# Patient Record
Sex: Female | Born: 1980 | Race: White | Hispanic: No | Marital: Married | Smoking: Never smoker
Health system: Southern US, Community
[De-identification: ages and names within clinical notes are randomized; demographics above are authoritative.]

## PROBLEM LIST (undated history)

## (undated) DIAGNOSIS — E559 Vitamin D deficiency, unspecified: Secondary | ICD-10-CM

## (undated) DIAGNOSIS — F419 Anxiety disorder, unspecified: Secondary | ICD-10-CM

## (undated) DIAGNOSIS — J302 Other seasonal allergic rhinitis: Secondary | ICD-10-CM

## (undated) HISTORY — DX: Vitamin D deficiency, unspecified: E55.9

## (undated) HISTORY — DX: Anxiety disorder, unspecified: F41.9

---

## 2005-02-13 ENCOUNTER — Ambulatory Visit: Payer: Self-pay | Admitting: Internal Medicine

## 2007-06-12 ENCOUNTER — Ambulatory Visit: Payer: Self-pay | Admitting: Internal Medicine

## 2007-06-20 ENCOUNTER — Ambulatory Visit: Payer: Self-pay | Admitting: General Surgery

## 2007-06-22 ENCOUNTER — Emergency Department: Payer: Self-pay | Admitting: General Surgery

## 2007-07-18 HISTORY — PX: CHOLECYSTECTOMY: SHX55

## 2010-11-16 ENCOUNTER — Ambulatory Visit (INDEPENDENT_AMBULATORY_CARE_PROVIDER_SITE_OTHER): Payer: BC Managed Care – PPO | Admitting: Family Medicine

## 2010-11-16 ENCOUNTER — Encounter: Payer: Self-pay | Admitting: Family Medicine

## 2010-11-16 VITALS — BP 130/85 | Ht 64.0 in | Wt 140.0 lb

## 2010-11-16 DIAGNOSIS — M84362A Stress fracture, left tibia, initial encounter for fracture: Secondary | ICD-10-CM

## 2010-11-16 DIAGNOSIS — M84369A Stress fracture, unspecified tibia and fibula, initial encounter for fracture: Secondary | ICD-10-CM

## 2010-11-16 NOTE — Progress Notes (Signed)
  Subjective:    Patient ID: Andrea Velez, female    DOB: 1981/03/30, 30 y.o.   MRN: [id number]  HPI 30 year old new female here for evaluation of left lower extremity pain. Patient states she started running 3 months ago, has worked her way up to running 9 miles per week, usually doing 3 miles 3 times per week. She states she started having ankle pain about 4 weeks ago during her run, and this was just after she got some new [employer]ics running shoes. This continued for the next couple weeks, and then she bought some new Brooks shoes to run in. This seemed to help but her pain some but her left lower shin pain persisted. Most notably 5 days ago she ran in a 5K, and by the end of the race she states her left lower extremity in the shin and ankle was extremely painful and she could barely walk. She is worried about a stress fracture. The pain is somewhat constant now, but definite worse with walking. Occasionally wakes her up at night. She has not taken any medicines for it.   Review of Systems Denies fever, sweats, chills, weight loss. Social history: Married with 2 daughters. She works at an [employer]. She does not smoke. Otherwise healthy.    Objective:   Physical Exam Gen. appearance: Well-appearing female in no distress Neck: Supple Psych: Normal affect Neuro: alert and oriented Eyes: Extraocular muscles intact ENT: Moist because membranes Lungs no labored breathing Abdomen: Soft Left knee full range of motion no effusion Left tibia: Positive tenderness at the midportion of the posterior medial border of the tibia as well as distal third about 2-3 inches proximal from the medial malleolus. She has good ankle range of motion and strength. She is unable to perform a hop test. She also has pain with tuning fork test. She has a negative tap test.  MSK. ultrasound of the left tibia: Longitudinal and transverse images of the midsection of the tibia show increased Doppler flow with minimal  edema but no cortical irregularities. At the distal third of the tibia, there is increased blood flow with edema and a small cortical irregularity best seen on transverse view.      Assessment & Plan:  Left distal third posterior medial tibial stress fracture -Placed her in a long-leg Aircast -Gave her the tibial stress fracture rehabilitation protocol, and explained the need to refrain from running in a 5K anytime soon -Calcium 2000 mg daily along with [phone] IUs of vitamin D daily as well -Toe Lifts and heel lifts for lower extremity strengthening -Followup in one month

## 2010-12-14 ENCOUNTER — Encounter: Payer: Self-pay | Admitting: Family Medicine

## 2010-12-14 ENCOUNTER — Ambulatory Visit (INDEPENDENT_AMBULATORY_CARE_PROVIDER_SITE_OTHER): Payer: BC Managed Care – PPO | Admitting: Family Medicine

## 2010-12-14 VITALS — BP 119/80

## 2010-12-14 DIAGNOSIS — M76829 Posterior tibial tendinitis, unspecified leg: Secondary | ICD-10-CM

## 2010-12-14 DIAGNOSIS — M76822 Posterior tibial tendinitis, left leg: Secondary | ICD-10-CM

## 2010-12-14 DIAGNOSIS — M84369A Stress fracture, unspecified tibia and fibula, initial encounter for fracture: Secondary | ICD-10-CM

## 2010-12-14 DIAGNOSIS — M84362A Stress fracture, left tibia, initial encounter for fracture: Secondary | ICD-10-CM

## 2010-12-16 DIAGNOSIS — M76822 Posterior tibial tendinitis, left leg: Secondary | ICD-10-CM | POA: Insufficient documentation

## 2010-12-16 NOTE — Progress Notes (Signed)
  Subjective:    Patient ID: Andrea Velez, female    DOB: March 27, 1981, 30 y.o.   MRN: [id number]  HPI 30 yo F f/u Lt tibial stress fx.  Has been doing long leg aircast, Ca++, Vit D, and return to activity protocol.  Did not bike 1st 2 weeks, instead did Pilates pain free.  Has ran twice, having a little pain in shin but not much.  Also icing. Also continues with medial ankle pain.  This was her original complaint last visit as well, but at that time I felt it was related to her shin stress fx.   Review of Systems No F, S, C    Objective:   Physical Exam Gen: NAD Lt leg: point ttp over distal 1/3 posteromedial tibia, improved from last exam. Lt ankle: Mild/moderate tenderness in the medial ankle in the region of the posterior tibial tendon. Minimal swelling in this area. Normal posterior tibial pulse. Normal posterior tibial tendon function. Gait: She has fairly neutral feet but does go into slight overpronation left greater than right with walking.  MSK [place] Lt tibia: Decreased neovessels. Still shows persistent small cortical defect on transverse view of the distal third of the tibia. Minimal overlying hypoechoic fluid. MSK ultrasound left ankle: Mild increased thickness of the posterior tibial tendon with surrounding hypoechoic fluid. No significant increased Doppler flow. Longitudinal view shows no obvious tear.       Assessment & Plan:  #1 left tibial stress fracture -Rest a couple of days, and then she will restart her rehabilitation protocol at the beginning of week 3 of the program. She really needs to keep her pain less than at 3/10 when running. -Continue her long leg Aircast -Continue calcium and vitamin D -Followup in one month  #2 left posterior tibialis tendinitis -Think there is evidence of this on ultrasound and is likely the cause of her medial ankle pain. -Do not think at this point is true tendinopathy with no neovascularity -Sports insoles with scaphoid pads to  prevent overpronation -Over-the-counter Aleve and ibuprofen for pain -If this does not work, and next visit I would probably start with a topical anti-inflammatory, but can consider nitroglycerin. However I like to avoid this if I can and until her stress fracture heals -Followup 1 month

## 2011-01-11 ENCOUNTER — Ambulatory Visit: Payer: BC Managed Care – PPO | Admitting: Family Medicine

## 2013-12-12 LAB — HM PAP SMEAR: HM Pap smear: NEGATIVE

## 2014-10-28 DIAGNOSIS — Z9109 Other allergy status, other than to drugs and biological substances: Secondary | ICD-10-CM

## 2014-10-28 DIAGNOSIS — F419 Anxiety disorder, unspecified: Secondary | ICD-10-CM

## 2014-12-17 ENCOUNTER — Encounter: Payer: Self-pay | Admitting: *Deleted

## 2014-12-18 ENCOUNTER — Encounter: Payer: Self-pay | Admitting: Obstetrics and Gynecology

## 2014-12-18 ENCOUNTER — Ambulatory Visit (INDEPENDENT_AMBULATORY_CARE_PROVIDER_SITE_OTHER): Payer: BC Managed Care – PPO | Admitting: Obstetrics and Gynecology

## 2014-12-18 VITALS — BP 131/79 | HR 66 | Ht 64.0 in | Wt 136.8 lb

## 2014-12-18 DIAGNOSIS — E559 Vitamin D deficiency, unspecified: Secondary | ICD-10-CM

## 2014-12-18 DIAGNOSIS — M79671 Pain in right foot: Secondary | ICD-10-CM | POA: Diagnosis not present

## 2014-12-18 DIAGNOSIS — Z0000 Encounter for general adult medical examination without abnormal findings: Secondary | ICD-10-CM

## 2014-12-18 MED ORDER — VITAMIN D (ERGOCALCIFEROL) 1.25 MG (50000 UNIT) PO CAPS: 50000.0000 [IU] | ORAL_CAPSULE | ORAL | Status: DC

## 2014-12-18 NOTE — Evaluation (Signed)
SUBJECTIVE:  34 y.o. female for annual routine Pap and checkup. Current Outpatient Prescriptions  Medication Sig Dispense Refill  . levonorgestrel (MIRENA) 20 MCG/24HR IUD 1 each by Intrauterine route once.    . loratadine (CLARITIN) 10 MG tablet Take 10 mg by mouth daily.    . Vitamin D, Ergocalciferol, (DRISDOL) 50000 UNITS CAPS capsule Take 1 capsule by mouth once a week.  4   No current facility-administered medications for this visit.   Allergies: Review of patient's allergies indicates no known allergies.  No LMP recorded (lmp unknown). Patient is not currently having periods (Reason: IUD).  ROS:  Feeling well. No dyspnea or chest pain on exertion.  No abdominal pain, change in bowel habits, black or bloody stools.  No urinary tract symptoms. GYN ROS: normal menses, no abnormal bleeding, pelvic pain or discharge, no breast pain or new or enlarging lumps on self exam. No neurological complaints.  OBJECTIVE:  The patient appears well, alert, oriented x 3, in no distress. BP 131/79 mmHg  Pulse 66  Ht 5\' 4"  (1.626 m)  Wt 136 lb 12.8 oz (62.052 kg)  BMI 23.47 kg/m2  LMP  (LMP Unknown) ENT normal.  Neck supple. No adenopathy or thyromegaly. PERLA. Lungs are clear, good air entry, no wheezes, rhonchi or rales. S1 and S2 normal, no murmurs, regular rate and rhythm. Abdomen soft without tenderness, guarding, mass or organomegaly. Extremities show no edema, normal peripheral pulses. Neurological is normal, no focal findings.  BREAST EXAM: breasts appear normal, no suspicious masses, no skin or nipple changes or axillary nodes  PELVIC EXAM: normal external genitalia, vulva, vagina, cervix (IUD string noted), uterus and adnexa  Rectal: deferred  Musculoskeletal: left great toe slightly edematous with pain at joint on palpation; crepitous noted.  ASSESSMENT:  1.well woman 2. Left great toe pain 3. Vitamin D deficiency  PLAN:  1.pap smear return annually or prn 2.Refer to [facility]  for evaluation 3.Refill Ergocalciferol x 1 year  Melody BurchardBurr, [place]

## 2014-12-25 ENCOUNTER — Other Ambulatory Visit: Payer: BC Managed Care – PPO

## 2014-12-25 ENCOUNTER — Encounter: Payer: Self-pay | Admitting: *Deleted

## 2014-12-25 LAB — PAP, THINPREP RFLX HPV WITH CT/GC: PAP SMEAR COMMENT: 0

## 2014-12-25 NOTE — Progress Notes (Signed)
Quick Note:  Please let her know her pa was negative, will need repeat in 3 years ______

## 2014-12-28 ENCOUNTER — Encounter: Payer: Self-pay | Admitting: *Deleted

## 2014-12-30 LAB — COMPREHENSIVE METABOLIC PANEL
ALT: 8 IU/L (ref 0–32)
AST: 17 IU/L (ref 0–40)
Albumin/Globulin Ratio: 1.9 (ref 1.1–2.5)
Albumin: 4.8 g/dL (ref 3.5–5.5)
Alkaline Phosphatase: 69 IU/L (ref 39–117)
BUN/Creatinine Ratio: 19 (ref 8–20)
BUN: 16 mg/dL (ref 6–20)
Bilirubin Total: 1 mg/dL (ref 0.0–1.2)
CHLORIDE: 101 mmol/L (ref 97–108)
CO2: 24 mmol/L (ref 18–29)
Calcium: 9.5 mg/dL (ref 8.7–10.2)
Creatinine, Ser: 0.83 mg/dL (ref 0.57–1.00)
GFR calc Af Amer: 106 mL/min/{1.73_m2} (ref >59)
GFR calc non Af Amer: 92 mL/min/{1.73_m2} (ref >59)
GLUCOSE: 90 mg/dL (ref 65–99)
Globulin, Total: 2.5 g/dL (ref 1.5–4.5)
POTASSIUM: 4.6 mmol/L (ref 3.5–5.2)
SODIUM: 140 mmol/L (ref 134–144)
TOTAL PROTEIN: 7.3 g/dL (ref 6.0–8.5)

## 2014-12-30 LAB — LIPID PANEL
Chol/HDL Ratio: 2.7 ratio units (ref 0.0–4.4)
Cholesterol, Total: 189 mg/dL (ref 100–199)
HDL: 69 mg/dL (ref >39)
LDL Calculated: 102 mg/dL — ABNORMAL HIGH (ref 0–99)
Triglycerides: 88 mg/dL (ref 0–149)
VLDL Cholesterol Cal: 18 mg/dL (ref 5–40)

## 2014-12-30 LAB — VITAMIN D 25 HYDROXY (VIT D DEFICIENCY, FRACTURES): Vit D, 25-Hydroxy: 33.5 ng/mL (ref 30.0–100.0)

## 2014-12-30 NOTE — Progress Notes (Signed)
Quick Note:  Please let her know all labs were normal, alse see if she has heard from podiatry about referral yet? ______

## 2015-01-01 ENCOUNTER — Encounter: Payer: Self-pay | Admitting: *Deleted

## 2015-01-13 ENCOUNTER — Telehealth: Payer: Self-pay | Admitting: Obstetrics and Gynecology

## 2015-01-13 NOTE — Telephone Encounter (Signed)
PT CALLED AND SHE LEFT MESSAG EON OFFICE MACHINE STATING SHE WOULD LIKE FOR YOU TO CALL HER BACK, SHE IS AT WORK YOU CAN CALL HER AT [phone] OR HER CELL.

## 2015-01-13 NOTE — Telephone Encounter (Signed)
Called pt gave lab results to pt, all results was mailed to pt, per pt states she never received them, mailed copies to pt again, [facility] to make sure rx was there, it was, pt hasnt heard about her Podiatry referral, and would like invite to [employer] [facility]

## 2015-01-14 ENCOUNTER — Other Ambulatory Visit: Payer: Self-pay | Admitting: Obstetrics and Gynecology

## 2015-01-14 DIAGNOSIS — M79673 Pain in unspecified foot: Secondary | ICD-10-CM

## 2015-03-08 ENCOUNTER — Ambulatory Visit: Payer: Self-pay | Admitting: Podiatry

## 2015-03-10 ENCOUNTER — Ambulatory Visit: Payer: Self-pay | Admitting: Podiatry

## 2015-12-22 ENCOUNTER — Telehealth: Payer: Self-pay | Admitting: Obstetrics and Gynecology

## 2015-12-22 MED ORDER — FLUCONAZOLE 150 MG PO TABS: 150.0000 mg | ORAL_TABLET | Freq: Every day | ORAL | Status: DC

## 2015-12-22 NOTE — Telephone Encounter (Signed)
Per KC- MNS ok to send in diflucan. Med erx pt aware.

## 2015-12-22 NOTE — Telephone Encounter (Signed)
Patient called stating she has a yeast infection and wanted to know if something can be called in for her. She has an appointment on Friday for her annual exam. [facility] [address]

## 2015-12-24 ENCOUNTER — Encounter: Payer: Self-pay | Admitting: Obstetrics and Gynecology

## 2015-12-24 ENCOUNTER — Ambulatory Visit (INDEPENDENT_AMBULATORY_CARE_PROVIDER_SITE_OTHER): Payer: BC Managed Care – PPO | Admitting: Obstetrics and Gynecology

## 2015-12-24 ENCOUNTER — Telehealth: Payer: Self-pay | Admitting: *Deleted

## 2015-12-24 VITALS — BP 115/68 | HR 82 | Ht 64.0 in | Wt 131.1 lb

## 2015-12-24 DIAGNOSIS — E559 Vitamin D deficiency, unspecified: Secondary | ICD-10-CM | POA: Diagnosis not present

## 2015-12-24 DIAGNOSIS — Z01419 Encounter for gynecological examination (general) (routine) without abnormal findings: Secondary | ICD-10-CM

## 2015-12-24 NOTE — Progress Notes (Signed)
Subjective:   Andrea Velez is a 35 y.o. 702P2002 Caucasian female here for a routine well-woman exam.  No LMP recorded. Patient is not currently having periods (Reason: IUD).    Current complaints: currently treating yeast infection and having lower pelvic pains when exercising. PCP: me       does desire labs  Social History: Sexual: heterosexual Marital Status: married Living situation: with family Occupation: unknown occupation Tobacco/alcohol: no tobacco use Illicit drugs: no history of illicit drug use  The following portions of the patient's history were reviewed and updated as appropriate: allergies, current medications, past family history, past medical history, past social history, past surgical history and problem list.  Past Medical History Past Medical History  Diagnosis Date  . Anxiety   . Vitamin D deficiency     Past Surgical History Past Surgical History  Procedure Laterality Date  . Cholecystectomy  2009    Gynecologic History G2P2002  No LMP recorded. Patient is not currently having periods (Reason: IUD). Contraception: IUD Last Pap: 2016. Results were: normal Last mammogram: NA.   Obstetric History OB History  Gravida Para Term Preterm AB SAB TAB Ectopic Multiple Living  2 2 2       2     # Outcome Date GA Lbr Len/2nd Weight Sex Delivery Anes PTL Lv  2 Term 2005   7 lb 14 oz (3.572 kg) F Vag-Spont   Y  1 Term 2002   7 lb 14 oz (3.572 kg) F Vag-Spont   Y      Current Medications Current Outpatient Prescriptions on File Prior to Visit  Medication Sig Dispense Refill  . levonorgestrel (MIRENA) 20 MCG/24HR IUD 1 each by Intrauterine route once.    . Vitamin D, Ergocalciferol, (DRISDOL) 50000 UNITS CAPS capsule Take 1 capsule (50,000 Units total) by mouth once a week. 30 capsule 4   No current facility-administered medications on file prior to visit.    Review of Systems Patient denies any headaches, blurred vision, shortness of breath, chest  pain, abdominal pain, problems with bowel movements, urination, or intercourse.  Objective:  BP 115/68 mmHg  Pulse 82  Ht 5\' 4"  (1.626 m)  Wt 131 lb 1.6 oz (59.467 kg)  BMI 22.49 kg/m2 Physical Exam  General:  Well developed, well nourished, no acute distress. She is alert and oriented x3. Skin:  Warm and dry Neck:  Midline trachea, no thyromegaly or nodules Cardiovascular: Regular rate and rhythm, no murmur heard Lungs:  Effort normal, all lung fields clear to auscultation bilaterally Breasts:  No dominant palpable mass, retraction, or nipple discharge Abdomen:  Soft, non tender, no hepatosplenomegaly or masses Pelvic:  External genitalia is normal in appearance.  The vagina is normal in appearance. The cervix is bulbous, no CMT.  Thin prep pap is not done.  IUD string noted Uterus is felt to be normal size, shape, and contour.  No adnexal masses or tenderness noted. Extremities:  No swelling or varicosities noted Psych:  She has a normal mood and affect  Assessment:   Healthy well-woman exam IUD check Vit D deficiency  Plan:  Routine screening labs F/U 1 year for AE, or sooner if needed   Olivea Sonnen Suzan NailerN Deshawna Mcneece, CNM

## 2015-12-24 NOTE — Patient Instructions (Signed)
Place annual gynecologic exam patient instructions here.

## 2015-12-24 NOTE — Telephone Encounter (Signed)
Patient wanted to ask Andrea Velez if she needed to change her IUD next year or this year. Patient is requesting a call back . [phone]. Thanks

## 2015-12-24 NOTE — Telephone Encounter (Signed)
It is next year- and remind her it will need to be a separate appointment for well visit.

## 2015-12-25 LAB — LIPID PANEL
CHOL/HDL RATIO: 2.6 ratio (ref 0.0–4.4)
CHOLESTEROL TOTAL: 199 mg/dL (ref 100–199)
HDL: 77 mg/dL (ref >39)
LDL CALC: 112 mg/dL — AB (ref 0–99)
Triglycerides: 50 mg/dL (ref 0–149)
VLDL CHOLESTEROL CAL: 10 mg/dL (ref 5–40)

## 2015-12-25 LAB — COMPREHENSIVE METABOLIC PANEL
ALK PHOS: 54 IU/L (ref 39–117)
ALT: 8 IU/L (ref 0–32)
AST: 16 IU/L (ref 0–40)
Albumin/Globulin Ratio: 1.9 (ref 1.2–2.2)
Albumin: 4.9 g/dL (ref 3.5–5.5)
BUN/Creatinine Ratio: 15 (ref 9–23)
BUN: 13 mg/dL (ref 6–20)
Bilirubin Total: 0.9 mg/dL (ref 0.0–1.2)
CALCIUM: 9.5 mg/dL (ref 8.7–10.2)
CO2: 21 mmol/L (ref 18–29)
CREATININE: 0.89 mg/dL (ref 0.57–1.00)
Chloride: 97 mmol/L (ref 96–106)
GFR calc Af Amer: 97 mL/min/{1.73_m2} (ref >59)
GFR, EST NON AFRICAN AMERICAN: 84 mL/min/{1.73_m2} (ref >59)
GLUCOSE: 85 mg/dL (ref 65–99)
Globulin, Total: 2.6 g/dL (ref 1.5–4.5)
Potassium: 3.9 mmol/L (ref 3.5–5.2)
Sodium: 138 mmol/L (ref 134–144)
Total Protein: 7.5 g/dL (ref 6.0–8.5)

## 2015-12-25 LAB — VITAMIN D 25 HYDROXY (VIT D DEFICIENCY, FRACTURES): VIT D 25 HYDROXY: 57.9 ng/mL (ref 30.0–100.0)

## 2015-12-27 ENCOUNTER — Telehealth: Payer: Self-pay | Admitting: Obstetrics and Gynecology

## 2015-12-27 MED ORDER — FLUCONAZOLE 150 MG PO TABS: 150.0000 mg | ORAL_TABLET | Freq: Once | ORAL | Status: DC

## 2015-12-27 NOTE — Telephone Encounter (Signed)
Pt aware Diflucan prescription sent to pharmacy. No additional refills.

## 2015-12-27 NOTE — Telephone Encounter (Signed)
Pt was told by Mel that she would call in another one time dosage for yeast infection medication, pt hasn't received it yet. Pt uses the [facility] in [place]. Please Francee Piccoloadivse

## 2016-01-26 ENCOUNTER — Other Ambulatory Visit: Payer: Self-pay | Admitting: Obstetrics and Gynecology

## 2016-12-29 ENCOUNTER — Encounter: Payer: Self-pay | Admitting: Obstetrics and Gynecology

## 2016-12-29 ENCOUNTER — Other Ambulatory Visit: Payer: Self-pay | Admitting: *Deleted

## 2016-12-29 ENCOUNTER — Other Ambulatory Visit: Payer: Self-pay | Admitting: Obstetrics and Gynecology

## 2016-12-29 ENCOUNTER — Ambulatory Visit (INDEPENDENT_AMBULATORY_CARE_PROVIDER_SITE_OTHER): Payer: 59 | Admitting: Obstetrics and Gynecology

## 2016-12-29 VITALS — BP 125/79 | HR 68 | Ht 64.0 in | Wt 132.0 lb

## 2016-12-29 DIAGNOSIS — Z01419 Encounter for gynecological examination (general) (routine) without abnormal findings: Secondary | ICD-10-CM

## 2016-12-29 DIAGNOSIS — Z30431 Encounter for routine checking of intrauterine contraceptive device: Secondary | ICD-10-CM | POA: Diagnosis not present

## 2016-12-29 MED ORDER — VITAMIN D (ERGOCALCIFEROL) 1.25 MG (50000 UNIT) PO CAPS: 50000.0000 [IU] | ORAL_CAPSULE | ORAL | 4 refills | Status: DC

## 2016-12-29 NOTE — Progress Notes (Signed)
Subjective:   Andrea Velez is a 36 y.o. 492P2002 Caucasian female here for a routine well-woman exam.  No LMP recorded. Patient is not currently having periods (Reason: IUD).    Current complaints: none PCP: me       does desire labs  Social History: Sexual: heterosexual Marital Status: married Living situation: with family Occupation: unknown occupation Tobacco/alcohol: no tobacco use Illicit drugs: no history of illicit drug use  The following portions of the patient's history were reviewed and updated as appropriate: allergies, current medications, past family history, past medical history, past social history, past surgical history and problem list.  Past Medical History Past Medical History:  Diagnosis Date  . Anxiety   . Vitamin D deficiency     Past Surgical History Past Surgical History:  Procedure Laterality Date  . CHOLECYSTECTOMY  2009    Gynecologic History G2P2002  No LMP recorded. Patient is not currently having periods (Reason: IUD). Contraception: IUD and vasectomy Last Pap: 2016. Results were: normal   Obstetric History OB History  Gravida Para Term Preterm AB Living  2 2 2     2   SAB TAB Ectopic Multiple Live Births          2    # Outcome Date GA Lbr Len/2nd Weight Sex Delivery Anes PTL Lv  2 Term 2005   7 lb 14 oz (3.572 kg) F Vag-Spont   LIV  1 Term 2002   7 lb 14 oz (3.572 kg) F Vag-Spont   LIV      Current Medications Current Outpatient Prescriptions on File Prior to Visit  Medication Sig Dispense Refill  . levonorgestrel (MIRENA) 20 MCG/24HR IUD 1 each by Intrauterine route once.    . Multiple Vitamins-Minerals (MULTIVITAMIN WITH MINERALS) tablet Take 1 tablet by mouth daily.    . fluconazole (DIFLUCAN) 150 MG tablet Take 1 tablet (150 mg total) by mouth once. (Patient not taking: Reported on 12/29/2016) 1 tablet 0  . Vitamin D, Ergocalciferol, (DRISDOL) 50000 units CAPS capsule TAKE ONE CAPSULE BY MOUTH ONCE A WEEK (Patient not taking:  Reported on 12/29/2016) 30 capsule 4   No current facility-administered medications on file prior to visit.     Review of Systems Patient denies any headaches, blurred vision, shortness of breath, chest pain, abdominal pain, problems with bowel movements, urination, or intercourse.  Objective:  BP 125/79   Pulse 68   Ht 5\' 4"  (1.626 m)   Wt 132 lb (59.9 kg)   BMI 22.66 kg/m  Physical Exam  General:  Well developed, well nourished, no acute distress. She is alert and oriented x3. Skin:  Warm and dry Neck:  Midline trachea, no thyromegaly or nodules Cardiovascular: Regular rate and rhythm, no murmur heard Lungs:  Effort normal, all lung fields clear to auscultation bilaterally Breasts:  No dominant palpable mass, retraction, or nipple discharge Abdomen:  Soft, non tender, no hepatosplenomegaly or masses Pelvic:  External genitalia is normal in appearance.  The vagina is normal in appearance. The cervix is bulbous, no CMT. IUD string noted  Thin prep pap is done with HR HPV cotesting. Uterus is felt to be normal size, shape, and contour.  No adnexal masses or tenderness noted.  Extremities:  No swelling or varicosities noted Psych:  She has a normal mood and affect  Assessment:   Healthy well-woman exam IUD check  Plan:  Will schedule appt to replace Mirena in the fall F/U 1 year for AE, or sooner if needed Mammogram  baseline ordered   Ruthann Angulo Suzan Nailer, CNM

## 2016-12-29 NOTE — Patient Instructions (Signed)
Preventive Care 18-39 Years, Female Preventive care refers to lifestyle choices and visits with your health care provider that can promote health and wellness. What does preventive care include?  A yearly physical exam. This is also called an annual well check.  Dental exams once or twice a year.  Routine eye exams. Ask your health care provider how often you should have your eyes checked.  Personal lifestyle choices, including: ? Daily care of your teeth and gums. ? Regular physical activity. ? Eating a healthy diet. ? Avoiding tobacco and drug use. ? Limiting alcohol use. ? Practicing safe sex. ? Taking vitamin and mineral supplements as recommended by your health care provider. What happens during an annual well check? The services and screenings done by your health care provider during your annual well check will depend on your age, overall health, lifestyle risk factors, and family history of disease. Counseling Your health care provider may ask you questions about your:  Alcohol use.  Tobacco use.  Drug use.  Emotional well-being.  Home and relationship well-being.  Sexual activity.  Eating habits.  Work and work Statistician.  Method of birth control.  Menstrual cycle.  Pregnancy history.  Screening You may have the following tests or measurements:  Height, weight, and BMI.  Diabetes screening. This is done by checking your blood sugar (glucose) after you have not eaten for a while (fasting).  Blood pressure.  Lipid and cholesterol levels. These may be checked every 5 years starting at age 38.  Skin check.  Hepatitis C blood test.  Hepatitis B blood test.  Sexually transmitted disease (STD) testing.  BRCA-related cancer screening. This may be done if you have a family history of breast, ovarian, tubal, or peritoneal cancers.  Pelvic exam and Pap test. This may be done every 3 years starting at age 38. Starting at age 30, this may be done  every 5 years if you have a Pap test in combination with an HPV test.  Discuss your test results, treatment options, and if necessary, the need for more tests with your health care provider. Vaccines Your health care provider may recommend certain vaccines, such as:  Influenza vaccine. This is recommended every year.  Tetanus, diphtheria, and acellular pertussis (Tdap, Td) vaccine. You may need a Td booster every 10 years.  Varicella vaccine. You may need this if you have not been vaccinated.  HPV vaccine. If you are 39 or younger, you may need three doses over 6 months.  Measles, mumps, and rubella (MMR) vaccine. You may need at least one dose of MMR. You may also need a second dose.  Pneumococcal 13-valent conjugate (PCV13) vaccine. You may need this if you have certain conditions and were not previously vaccinated.  Pneumococcal polysaccharide (PPSV23) vaccine. You may need one or two doses if you smoke cigarettes or if you have certain conditions.  Meningococcal vaccine. One dose is recommended if you are age 68-21 years and a first-year college student living in a residence hall, or if you have one of several medical conditions. You may also need additional booster doses.  Hepatitis A vaccine. You may need this if you have certain conditions or if you travel or work in places where you may be exposed to hepatitis A.  Hepatitis B vaccine. You may need this if you have certain conditions or if you travel or work in places where you may be exposed to hepatitis B.  Haemophilus influenzae type b (Hib) vaccine. You may need this  if you have certain risk factors.  Talk to your health care provider about which screenings and vaccines you need and how often you need them. This information is not intended to replace advice given to you by your health care provider. Make sure you discuss any questions you have with your health care provider. Document Released: 08/29/2001 Document Revised:  03/22/2016 Document Reviewed: 05/04/2015 Elsevier Interactive Patient Education  2017 Elsevier Inc.  

## 2016-12-30 LAB — COMPREHENSIVE METABOLIC PANEL
ALBUMIN: 4.9 g/dL (ref 3.5–5.5)
ALT: 7 IU/L (ref 0–32)
AST: 18 IU/L (ref 0–40)
Albumin/Globulin Ratio: 2 (ref 1.2–2.2)
Alkaline Phosphatase: 59 IU/L (ref 39–117)
BUN / CREAT RATIO: 14 (ref 9–23)
BUN: 11 mg/dL (ref 6–20)
Bilirubin Total: 0.8 mg/dL (ref 0.0–1.2)
CO2: 24 mmol/L (ref 20–29)
CREATININE: 0.78 mg/dL (ref 0.57–1.00)
Calcium: 9.6 mg/dL (ref 8.7–10.2)
Chloride: 102 mmol/L (ref 96–106)
GFR calc non Af Amer: 98 mL/min/{1.73_m2} (ref >59)
GFR, EST AFRICAN AMERICAN: 113 mL/min/{1.73_m2} (ref >59)
GLUCOSE: 90 mg/dL (ref 65–99)
Globulin, Total: 2.5 g/dL (ref 1.5–4.5)
Potassium: 4.8 mmol/L (ref 3.5–5.2)
Sodium: 141 mmol/L (ref 134–144)
TOTAL PROTEIN: 7.4 g/dL (ref 6.0–8.5)

## 2016-12-30 LAB — LIPID PANEL
CHOL/HDL RATIO: 2.7 ratio (ref 0.0–4.4)
Cholesterol, Total: 188 mg/dL (ref 100–199)
HDL: 70 mg/dL (ref >39)
LDL Calculated: 100 mg/dL — ABNORMAL HIGH (ref 0–99)
Triglycerides: 92 mg/dL (ref 0–149)
VLDL Cholesterol Cal: 18 mg/dL (ref 5–40)

## 2017-01-03 LAB — CYTOLOGY - PAP

## 2017-01-18 ENCOUNTER — Ambulatory Visit
Admission: RE | Admit: 2017-01-18 | Discharge: 2017-01-18 | Disposition: A | Discharge status: Home / Self Care | Payer: 59 | Source: Ambulatory Visit | Attending: Obstetrics and Gynecology | Admitting: Obstetrics and Gynecology

## 2017-01-18 DIAGNOSIS — Z1231 Encounter for screening mammogram for malignant neoplasm of breast: Secondary | ICD-10-CM | POA: Diagnosis present

## 2017-01-18 DIAGNOSIS — Z01419 Encounter for gynecological examination (general) (routine) without abnormal findings: Secondary | ICD-10-CM | POA: Insufficient documentation

## 2017-03-23 ENCOUNTER — Ambulatory Visit: Payer: 59 | Admitting: Obstetrics and Gynecology

## 2017-04-05 ENCOUNTER — Ambulatory Visit (INDEPENDENT_AMBULATORY_CARE_PROVIDER_SITE_OTHER): Payer: 59 | Admitting: Obstetrics and Gynecology

## 2017-04-05 ENCOUNTER — Encounter: Payer: Self-pay | Admitting: Obstetrics and Gynecology

## 2017-04-05 VITALS — BP 127/81 | HR 85 | Wt 124.2 lb

## 2017-04-05 DIAGNOSIS — Z30433 Encounter for removal and reinsertion of intrauterine contraceptive device: Secondary | ICD-10-CM | POA: Diagnosis not present

## 2017-04-05 NOTE — Patient Instructions (Signed)

## 2017-04-05 NOTE — Progress Notes (Signed)
Andrea Velez is a 36 y.o. year old G71P2002 Caucasian female who presents for removal and replacement of a Mirena IUD. She was given informed consent for removal and reinsertion of her Mirena. Her Mirena was placed 2012, No LMP recorded. Patient is not currently having periods (Reason: IUD)., and her pregnancy test today was negativ.   The risks and benefits of the method and placement have been thouroughly reviewed with the patient and all questions were answered.  Specifically the patient is aware of failure rate of 07/998, expulsion of the IUD and of possible perforation.  The patient is aware of irregular bleeding due to the method and understands the incidence of irregular bleeding diminishes with time.  Signed copy of informed consent in chart.   No LMP recorded. Patient is not currently having periods (Reason: IUD). BP 127/81   Pulse 85   Wt 124 lb 3.2 oz (56.3 kg)   BMI 21.32 kg/m  No results found for this or any previous visit (from the past 24 hour(s)).   Appropriate time out taken. A graves speculum was placed in the vagina.  The cervix was visualized, prepped using Betadine. The strings were visible. They were grasped and the Mirena was easily removed. The cervix was then grasped with a single-tooth tenaculum. The uterus was found to be neutral and it sounded to 8 cm.  Mirena IUD placed per manufacturer's recommendations without complications. The strings were trimmed to 3 cm.  The patient tolerated the procedure well.   Sonogram was performed and the proper placement of the IUD was verified via transvaginal u/s.   The patient was given post procedure instructions, including signs and symptoms of infection and to check for the strings after each menses or each month, and refraining from intercourse or anything in the vagina for 3 days.  She was given a Mirena care card with date Mirena placed, and date Mirena to be removed.    Anwen Cannedy Suzan Nailer, CNM

## 2017-05-24 ENCOUNTER — Telehealth: Payer: Self-pay | Admitting: Obstetrics and Gynecology

## 2017-05-24 NOTE — Telephone Encounter (Signed)
Patient called stating that she would like to receive a call back in regards to her bloodwork. No other information was disclosed. Please advise.

## 2017-05-28 NOTE — Telephone Encounter (Signed)
Called pt about daughters labs

## 2017-05-31 ENCOUNTER — Encounter: Payer: 59 | Admitting: Obstetrics and Gynecology

## 2018-01-04 ENCOUNTER — Encounter: Payer: 59 | Admitting: Obstetrics and Gynecology

## 2018-01-29 ENCOUNTER — Ambulatory Visit (INDEPENDENT_AMBULATORY_CARE_PROVIDER_SITE_OTHER): Payer: 59 | Admitting: Obstetrics and Gynecology

## 2018-01-29 ENCOUNTER — Encounter: Payer: Self-pay | Admitting: Obstetrics and Gynecology

## 2018-01-29 VITALS — BP 115/79 | HR 70 | Ht 64.0 in | Wt 132.3 lb

## 2018-01-29 DIAGNOSIS — Z01419 Encounter for gynecological examination (general) (routine) without abnormal findings: Secondary | ICD-10-CM | POA: Diagnosis not present

## 2018-01-29 MED ORDER — VITAMIN D (ERGOCALCIFEROL) 1.25 MG (50000 UNIT) PO CAPS: 50000.0000 [IU] | ORAL_CAPSULE | ORAL | 4 refills | Status: DC

## 2018-01-29 NOTE — Patient Instructions (Signed)
Preventive Care 18-39 Years, Female Preventive care refers to lifestyle choices and visits with your health care provider that can promote health and wellness. What does preventive care include?  A yearly physical exam. This is also called an annual well check.  Dental exams once or twice a year.  Routine eye exams. Ask your health care provider how often you should have your eyes checked.  Personal lifestyle choices, including: ? Daily care of your teeth and gums. ? Regular physical activity. ? Eating a healthy diet. ? Avoiding tobacco and drug use. ? Limiting alcohol use. ? Practicing safe sex. ? Taking vitamin and mineral supplements as recommended by your health care provider. What happens during an annual well check? The services and screenings done by your health care provider during your annual well check will depend on your age, overall health, lifestyle risk factors, and family history of disease. Counseling Your health care provider may ask you questions about your:  Alcohol use.  Tobacco use.  Drug use.  Emotional well-being.  Home and relationship well-being.  Sexual activity.  Eating habits.  Work and work Statistician.  Method of birth control.  Menstrual cycle.  Pregnancy history.  Screening You may have the following tests or measurements:  Height, weight, and BMI.  Diabetes screening. This is done by checking your blood sugar (glucose) after you have not eaten for a while (fasting).  Blood pressure.  Lipid and cholesterol levels. These may be checked every 5 years starting at age 38.  Skin check.  Hepatitis C blood test.  Hepatitis B blood test.  Sexually transmitted disease (STD) testing.  BRCA-related cancer screening. This may be done if you have a family history of breast, ovarian, tubal, or peritoneal cancers.  Pelvic exam and Pap test. This may be done every 3 years starting at age 38. Starting at age 30, this may be done  every 5 years if you have a Pap test in combination with an HPV test.  Discuss your test results, treatment options, and if necessary, the need for more tests with your health care provider. Vaccines Your health care provider may recommend certain vaccines, such as:  Influenza vaccine. This is recommended every year.  Tetanus, diphtheria, and acellular pertussis (Tdap, Td) vaccine. You may need a Td booster every 10 years.  Varicella vaccine. You may need this if you have not been vaccinated.  HPV vaccine. If you are 39 or younger, you may need three doses over 6 months.  Measles, mumps, and rubella (MMR) vaccine. You may need at least one dose of MMR. You may also need a second dose.  Pneumococcal 13-valent conjugate (PCV13) vaccine. You may need this if you have certain conditions and were not previously vaccinated.  Pneumococcal polysaccharide (PPSV23) vaccine. You may need one or two doses if you smoke cigarettes or if you have certain conditions.  Meningococcal vaccine. One dose is recommended if you are age 68-21 years and a first-year college student living in a residence hall, or if you have one of several medical conditions. You may also need additional booster doses.  Hepatitis A vaccine. You may need this if you have certain conditions or if you travel or work in places where you may be exposed to hepatitis A.  Hepatitis B vaccine. You may need this if you have certain conditions or if you travel or work in places where you may be exposed to hepatitis B.  Haemophilus influenzae type b (Hib) vaccine. You may need this  if you have certain risk factors.  Talk to your health care provider about which screenings and vaccines you need and how often you need them. This information is not intended to replace advice given to you by your health care provider. Make sure you discuss any questions you have with your health care provider. Document Released: 08/29/2001 Document Revised:  03/22/2016 Document Reviewed: 05/04/2015 Elsevier Interactive Patient Education  [employer].

## 2018-01-29 NOTE — Progress Notes (Signed)
Subjective:   Elmer Sowatalie B Coombs is a 37 y.o. 502P2002 Caucasian female here for a routine well-woman exam.  No LMP recorded. (Menstrual status: IUD).    Current complaints: none PCP: me       doesn't desire labs  Social History: Sexual: heterosexual Marital Status: married Living situation: with family Occupation: unknown occupation Tobacco/alcohol: no tobacco use Illicit drugs: no history of illicit drug use  The following portions of the patient's history were reviewed and updated as appropriate: allergies, current medications, past family history, past medical history, past social history, past surgical history and problem list.  Past Medical History Past Medical History:  Diagnosis Date  . Anxiety   . Vitamin D deficiency     Past Surgical History Past Surgical History:  Procedure Laterality Date  . CHOLECYSTECTOMY  2009    Gynecologic History G2P2002  No LMP recorded. (Menstrual status: IUD). Contraception: IUD Last Pap: 2018. Results were: normal Last mammogram: 2018. Results were: normal   Obstetric History OB History  Gravida Para Term Preterm AB Living  2 2 2     2   SAB TAB Ectopic Multiple Live Births          2    # Outcome Date GA Lbr Len/2nd Weight Sex Delivery Anes PTL Lv  2 Term 2005   7 lb 14 oz (3.572 kg) F Vag-Spont   LIV  1 Term 2002   7 lb 14 oz (3.572 kg) F Vag-Spont   LIV    Current Medications Current Outpatient Medications on File Prior to Visit  Medication Sig Dispense Refill  . levonorgestrel (MIRENA) 20 MCG/24HR IUD 1 each by Intrauterine route once.    . Multiple Vitamins-Minerals (MULTIVITAMIN WITH MINERALS) tablet Take 1 tablet by mouth daily.    . Vitamin D, Ergocalciferol, (DRISDOL) 50000 units CAPS capsule Take 1 capsule (50,000 Units total) by mouth once a week. (Patient not taking: Reported on 04/05/2017) 30 capsule 4   No current facility-administered medications on file prior to visit.     Review of Systems Patient denies  any headaches, blurred vision, shortness of breath, chest pain, abdominal pain, problems with bowel movements, urination, or intercourse.  Objective:  BP 115/79   Pulse 70   Ht 5\' 4"  (1.626 m)   Wt 132 lb 4.8 oz (60 kg)   BMI 22.71 kg/m  Physical Exam  General:  Well developed, well nourished, no acute distress. She is alert and oriented x3. Skin:  Warm and dry Neck:  Midline trachea, no thyromegaly or nodules Cardiovascular: Regular rate and rhythm, no murmur heard Lungs:  Effort normal, all lung fields clear to auscultation bilaterally Breasts:  No dominant palpable mass, retraction, or nipple discharge Abdomen:  Soft, non tender, no hepatosplenomegaly or masses Pelvic:  External genitalia is normal in appearance.  The vagina is normal in appearance. The cervix is bulbous, no CMT. IUD string noted. Thin prep pap isn't done. Uterus is felt to be normal size, shape, and contour.  No adnexal masses or tenderness noted. Extremities:  No swelling or varicosities noted Psych:  She has a normal mood and affect  Assessment:   Healthy well-woman exam IUD check   Plan:   F/U 1 year for AE, or sooner if needed   Keyaan Lederman Suzan NailerN Alyissa Whidbee, CNM

## 2018-07-05 ENCOUNTER — Encounter: Payer: 59 | Admitting: Obstetrics and Gynecology

## 2018-07-05 ENCOUNTER — Ambulatory Visit (INDEPENDENT_AMBULATORY_CARE_PROVIDER_SITE_OTHER): Payer: 59 | Admitting: Certified Nurse Midwife

## 2018-07-05 ENCOUNTER — Other Ambulatory Visit (HOSPITAL_COMMUNITY)
Admission: RE | Admit: 2018-07-05 | Discharge: 2018-07-05 | Disposition: A | Discharge status: Home / Self Care | Payer: 59 | Source: Ambulatory Visit | Attending: Certified Nurse Midwife | Admitting: Certified Nurse Midwife

## 2018-07-05 ENCOUNTER — Encounter: Payer: Self-pay | Admitting: Certified Nurse Midwife

## 2018-07-05 VITALS — BP 115/72 | HR 79 | Ht 64.0 in | Wt 136.0 lb

## 2018-07-05 DIAGNOSIS — T8384XA Pain from genitourinary prosthetic devices, implants and grafts, initial encounter: Secondary | ICD-10-CM | POA: Insufficient documentation

## 2018-07-05 DIAGNOSIS — N9089 Other specified noninflammatory disorders of vulva and perineum: Secondary | ICD-10-CM

## 2018-07-05 NOTE — Progress Notes (Signed)
GYN ENCOUNTER NOTE  Subjective:       Andrea Velez is a 37 y.o. 482P2002 female is here for gynecologic evaluation of the following issues:  1. Pelvic pain with IUD, notices intermittent right sided lower pelvic pain when exercising over the last three (3) months.  2. Requests removal of vulvar lesion that she's "had for years"  Has not checked for Mirena strings.   Denies difficulty breathing or respiratory distress, chest pain, vaginal bleeding, dysuria, and leg pain or swelling.   Mirena was removed and reinserted by [facility] on 03/2018.     Gynecologic History  No LMP recorded. (Menstrual status: IUD).  Contraception: IUD, Mirena  Last Pap: 12/2016. Results were: Negative/Negative  Obstetric History OB History  Gravida Para Term Preterm AB Living  2 2 2     2   SAB TAB Ectopic Multiple Live Births          2    # Outcome Date GA Lbr Len/2nd Weight Sex Delivery Anes PTL Lv  2 Term 2005   7 lb 14 oz (3.572 kg) F Vag-Spont   LIV  1 Term 2002   7 lb 14 oz (3.572 kg) F Vag-Spont   LIV    Past Medical History:  Diagnosis Date  . Anxiety   . Vitamin D deficiency     Past Surgical History:  Procedure Laterality Date  . CHOLECYSTECTOMY  2009    Current Outpatient Medications on File Prior to Visit  Medication Sig Dispense Refill  . levonorgestrel (MIRENA) 20 MCG/24HR IUD 1 each by Intrauterine route once.    . Multiple Vitamins-Minerals (MULTIVITAMIN WITH MINERALS) tablet Take 1 tablet by mouth daily.    . Vitamin D, Ergocalciferol, (DRISDOL) 50000 units CAPS capsule Take 1 capsule (50,000 Units total) by mouth once a week. 30 capsule 4   No current facility-administered medications on file prior to visit.     No Known Allergies  Social History   Socioeconomic History  . Marital status: Married    Spouse name: Not on file  . Number of children: Not on file  . Years of education: Not on file  . Highest education level: Not on file  Occupational History  . Not  on file  Social Needs  . Financial resource strain: Not on file  . Food insecurity:    Worry: Not on file    Inability: Not on file  . Transportation needs:    Medical: Not on file    Non-medical: Not on file  Tobacco Use  . Smoking status: Never Smoker  . Smokeless tobacco: Never Used  Substance and Sexual Activity  . Alcohol use: Yes    Alcohol/week: 1.0 - 2.0 standard drinks    Types: 1 - 2 Glasses of wine per week    Comment: weekly  . Drug use: No  . Sexual activity: Yes    Birth control/protection: I.U.D.    Comment: Mirena  Lifestyle  . Physical activity:    Days per week: Not on file    Minutes per session: Not on file  . Stress: Not on file  Relationships  . Social connections:    Talks on phone: Not on file    Gets together: Not on file    Attends religious service: Not on file    Active member of club or organization: Not on file    Attends meetings of clubs or organizations: Not on file    Relationship status: Not on file  .  Intimate partner violence:    Fear of current or ex partner: Not on file    Emotionally abused: Not on file    Physically abused: Not on file    Forced sexual activity: Not on file  Other Topics Concern  . Not on file  Social History Narrative  . Not on file    Family History  Problem Relation Age of Onset  . Diabetes Maternal Grandmother   . Cancer Paternal Aunt        breast  . Cancer Paternal Grandfather        colon  . Breast cancer Neg Hx   . Ovarian cancer Neg Hx   . Colon cancer Neg Hx     The following portions of the patient's history were reviewed and updated as appropriate: allergies, current medications, past family history, past medical history, past social history, past surgical history and problem list.  Review of Systems  ROS negative except as noted above. Information obtained from patient.   Objective:   BP 115/72   Pulse 79   Ht 5\' 4"  (1.626 m)   Wt 136 lb (61.7 kg)   BMI 23.34 kg/m     CONSTITUTIONAL: Well-developed, well-nourished female in no acute distress.   ABDOMEN: Soft, non distended; Non tender.  No Organomegaly.  PELVIC:  External Genitalia: Normal  Vagina: Normal  Cervix: Normal, IUD strings visible  Uterus: Normal size, shape,consistency, mobile  Adnexa: Normal  MUSCULOSKELETAL: Normal range of motion. No tenderness.  No cyanosis, clubbing, or edema.  Assessment:   1. Pain due to intrauterine contraceptive device (IUD), initial encounter (HCC)   2. Vulvar lesion  - Surgical pathology   Plan:   Findings reviewed with patient, verbalized understanding.   Would like to proceed with placement verification by ultrasound, see orders.   See procedure note below.   RTC for ultrasound.  Gunnar BullaJenkins Michelle Lameka Disla, CNM [facility], [facility]   Procedure Note:  After informed written consent was obtained, using Betadine for cleansing and 1% Lidocaine with epinephrine for anesthetic, with sterile technique a 3 mm punch biopsy was used to obtain a biopsy specimen of the lesion. Hemostasis was obtained by pressure and wound was not sutured after use of silver nitrate. Wound care instructions provided. Be alert for any signs of cutaneous infection. The specimen is labeled and sent to pathology for evaluation. The procedure was well tolerated without complications.  Reviewed red flag symptoms and when to call.    Gunnar BullaJenkins Michelle Carder Yin, CNM [facility], [facility]

## 2018-07-05 NOTE — Progress Notes (Signed)
Patient c/o lower pelvic discomfort when exercising that started 3 months ago, has not felt for IUD strings.

## 2018-07-05 NOTE — Patient Instructions (Signed)
Skin Biopsy, Care After This sheet gives you information about how to care for yourself after your procedure. Your health care provider may also give you more specific instructions. If you have problems or questions, contact your health care provider. What can I expect after the procedure? After the procedure, it is common to have:  Soreness.  Bruising.  Itching. Follow these instructions at home: Biopsy site care Follow instructions from your health care provider about how to take care of your biopsy site. Make sure you:  Wash your hands with soap and water before and after you change your bandage (dressing). If soap and water are not available, use hand sanitizer.  Apply ointment on your biopsy site as directed by your health care provider.  Change your dressing as told by your health care provider.  Leave stitches (sutures), skin glue, or adhesive strips in place. These skin closures may need to stay in place for 2 weeks or longer. If adhesive strip edges start to loosen and curl up, you may trim the loose edges. Do not remove adhesive strips completely unless your health care provider tells you to do that.  If the biopsy area bleeds, apply gentle pressure for 10 minutes. Check your biopsy site every day for signs of infection. Check for:  Redness, swelling, or pain.  Fluid or blood.  Warmth.  Pus or a bad smell.  General instructions  Rest and then return to your normal activities as told by your health care provider.  Take over-the-counter and prescription medicines only as told by your health care provider.  Keep all follow-up visits as told by your health care provider. This is important. Contact a health care provider if:  You have redness, swelling, or pain around your biopsy site.  You have fluid or blood coming from your biopsy site.  Your biopsy site feels warm to the touch.  You have pus or a bad smell coming from your biopsy site.  You have a  fever.  Your sutures, skin glue, or adhesive strips loosen or come off sooner than expected. Get help right away if:  You have bleeding that does not stop with pressure or a dressing. Summary  After the procedure, it is common to have soreness, bruising, and itching at the site.  Follow instructions from your health care provider about how to take care of your biopsy site.  Check your biopsy site every day for signs of infection.  Contact a health care provider if you have redness, swelling, or pain around your biopsy site, or your biopsy site feels warm to the touch.  Keep all follow-up visits as told by your health care provider. This is important. This information is not intended to replace advice given to you by your health care provider. Make sure you discuss any questions you have with your health care provider. Document Released: 07/30/2015 Document Revised: 12/31/2017 Document Reviewed: 12/31/2017 Elsevier Interactive Patient Education  2019 [employer] Levonorgestrel intrauterine device (IUD) What is this medicine? LEVONORGESTREL IUD (LEE voe nor jes trel) is a contraceptive (birth control) device. The device is placed inside the uterus by a healthcare professional. It is used to prevent pregnancy. This device can also be used to treat heavy bleeding that occurs during your period. This medicine may be used for other purposes; ask your health care provider or pharmacist if you have questions. COMMON BRAND NAME(S): Cameron AliKyleena, LILETTA, Mirena, Skyla What should I tell my health care provider before I take this medicine? They need  to know if you have any of these conditions: -abnormal Pap smear -cancer of the breast, uterus, or cervix -diabetes -endometritis -genital or pelvic infection now or in the past -have more than one sexual partner or your partner has more than one partner -heart disease -history of an ectopic or tubal pregnancy -immune system problems -IUD in  place -liver disease or tumor -problems with blood clots or take blood-thinners -seizures -use intravenous drugs -uterus of unusual shape -vaginal bleeding that has not been explained -an unusual or allergic reaction to levonorgestrel, other hormones, silicone, or polyethylene, medicines, foods, dyes, or preservatives -pregnant or trying to get pregnant -breast-feeding How should I use this medicine? This device is placed inside the uterus by a health care professional. Talk to your pediatrician regarding the use of this medicine in children. Special care may be needed. Overdosage: If you think you have taken too much of this medicine contact a poison control center or emergency room at once. NOTE: This medicine is only for you. Do not share this medicine with others. What if I miss a dose? This does not apply. Depending on the brand of device you have inserted, the device will need to be replaced every 3 to 5 years if you wish to continue using this type of birth control. What may interact with this medicine? Do not take this medicine with any of the following medications: -amprenavir -bosentan -fosamprenavir This medicine may also interact with the following medications: -aprepitant -armodafinil -barbiturate medicines for inducing sleep or treating seizures -bexarotene -boceprevir -griseofulvin -medicines to treat seizures like carbamazepine, ethotoin, felbamate, oxcarbazepine, phenytoin, topiramate -modafinil -pioglitazone -rifabutin -rifampin -rifapentine -some medicines to treat HIV infection like atazanavir, efavirenz, indinavir, lopinavir, nelfinavir, tipranavir, ritonavir -St. John's wort -warfarin This list may not describe all possible interactions. Give your health care provider a list of all the medicines, herbs, non-prescription drugs, or dietary supplements you use. Also tell them if you smoke, drink alcohol, or use illegal drugs. Some items may interact with your  medicine. What should I watch for while using this medicine? Visit your doctor or health care professional for regular check ups. See your doctor if you or your partner has sexual contact with others, becomes HIV positive, or gets a sexual transmitted disease. This product does not protect you against HIV infection (AIDS) or other sexually transmitted diseases. You can check the placement of the IUD yourself by reaching up to the top of your vagina with clean fingers to feel the threads. Do not pull on the threads. It is a good habit to check placement after each menstrual period. Call your doctor right away if you feel more of the IUD than just the threads or if you cannot feel the threads at all. The IUD may come out by itself. You may become pregnant if the device comes out. If you notice that the IUD has come out use a backup birth control method like condoms and call your health care provider. Using tampons will not change the position of the IUD and are okay to use during your period. This IUD can be safely scanned with magnetic resonance imaging (MRI) only under specific conditions. Before you have an MRI, tell your healthcare provider that you have an IUD in place, and which type of IUD you have in place. What side effects may I notice from receiving this medicine? Side effects that you should report to your doctor or health care professional as soon as possible: -allergic reactions like skin rash,  itching or hives, swelling of the face, lips, or tongue -fever, flu-like symptoms -genital sores -high blood pressure -no menstrual period for 6 weeks during use -pain, swelling, warmth in the leg -pelvic pain or tenderness -severe or sudden headache -signs of pregnancy -stomach cramping -sudden shortness of breath -trouble with balance, talking, or walking -unusual vaginal bleeding, discharge -yellowing of the eyes or skin Side effects that usually do not require medical attention (report  to your doctor or health care professional if they continue or are bothersome): -acne -breast pain -change in sex drive or performance -changes in weight -cramping, dizziness, or faintness while the device is being inserted -headache -irregular menstrual bleeding within first 3 to 6 months of use -nausea This list may not describe all possible side effects. Call your doctor for medical advice about side effects. You may report side effects to FDA at 1-800-FDA-1088. Where should I keep my medicine? This does not apply. NOTE: This sheet is a summary. It may not cover all possible information. If you have questions about this medicine, talk to your doctor, pharmacist, or health care provider.  2019 Elsevier/Gold Standard (2016-04-14 14:14:56)

## 2018-07-18 ENCOUNTER — Other Ambulatory Visit: Payer: 59

## 2018-07-24 ENCOUNTER — Ambulatory Visit (INDEPENDENT_AMBULATORY_CARE_PROVIDER_SITE_OTHER): Payer: 59

## 2018-07-24 ENCOUNTER — Ambulatory Visit (INDEPENDENT_AMBULATORY_CARE_PROVIDER_SITE_OTHER): Payer: 59 | Admitting: Obstetrics and Gynecology

## 2018-07-24 DIAGNOSIS — T8384XA Pain from genitourinary prosthetic devices, implants and grafts, initial encounter: Secondary | ICD-10-CM

## 2018-07-24 DIAGNOSIS — N83202 Unspecified ovarian cyst, left side: Secondary | ICD-10-CM

## 2018-07-24 DIAGNOSIS — R102 Pelvic and perineal pain: Secondary | ICD-10-CM | POA: Diagnosis not present

## 2018-07-24 NOTE — Patient Instructions (Signed)
Ovarian Cyst         An ovarian cyst is a fluid-filled sac that forms on an ovary. The ovaries are small organs that produce eggs in women. Various types of cysts can form on the ovaries. Some may cause symptoms and require treatment. Most ovarian cysts go away on their own, are not cancerous (are benign), and do not cause problems.  Common types of ovarian cysts include:  · Functional (follicle) cysts.  ? Occur during the menstrual cycle, and usually go away with the next menstrual cycle if you do not get pregnant.  ? Usually cause no symptoms.  · Endometriomas.  ? Are cysts that form from the tissue that lines the uterus (endometrium).  ? Are sometimes called “chocolate cysts” because they become filled with blood that turns brown.  ? Can cause pain in the lower abdomen during intercourse and during your period.  · Cystadenoma cysts.  ? Develop from cells on the outside surface of the ovary.  ? Can get very large and cause lower abdomen pain and pain with intercourse.  ? Can cause severe pain if they twist or break open (rupture).  · Dermoid cysts.  ? Are sometimes found in both ovaries.  ? May contain different kinds of body tissue, such as skin, teeth, hair, or cartilage.  ? Usually do not cause symptoms unless they get very big.  · Theca lutein cysts.  ? Occur when too much of a certain hormone (human chorionic gonadotropin) is produced and overstimulates the ovaries to produce an egg.  ? Are most common after having procedures used to assist with the conception of a baby (in vitro fertilization).  What are the causes?  Ovarian cysts may be caused by:  · Ovarian hyperstimulation syndrome. This is a condition that can develop from taking fertility medicines. It causes multiple large ovarian cysts to form.  · Polycystic ovarian syndrome (PCOS). This is a common hormonal disorder that can cause ovarian cysts, as well as problems with your period or fertility.  What increases the risk?  The following factors may  make you more likely to develop ovarian cysts:  · Being overweight or obese.  · Taking fertility medicines.  · Taking certain forms of hormonal birth control.  · Smoking.  What are the signs or symptoms?  Many ovarian cysts do not cause symptoms. If symptoms are present, they may include:  · Pelvic pain or pressure.  · Pain in the lower abdomen.  · Pain during sex.  · Abdominal swelling.  · Abnormal menstrual periods.  · Increasing pain with menstrual periods.  How is this diagnosed?  These cysts are commonly found during a routine pelvic exam. You may have tests to find out more about the cyst, such as:  · Ultrasound.  · X-ray of the pelvis.  · CT scan.  · MRI.  · Blood tests.  How is this treated?  Many ovarian cysts go away on their own without treatment. Your health care provider may want to check your cyst regularly for 2-3 months to see if it changes. If you are in menopause, it is especially important to have your cyst monitored closely because menopausal women have a higher rate of ovarian cancer.  When treatment is needed, it may include:  · Medicines to help relieve pain.  · A procedure to drain the cyst (aspiration).  · Surgery to remove the whole cyst.  · Hormone treatment or birth control pills. These methods are sometimes used   to help dissolve a cyst.  Follow these instructions at home:  · Take over-the-counter and prescription medicines only as told by your health care provider.  · Do not drive or use heavy machinery while taking prescription pain medicine.  · Get regular pelvic exams and Pap tests as often as told by your health care provider.  · Return to your normal activities as told by your health care provider. Ask your health care provider what activities are safe for you.  · Do not use any products that contain nicotine or tobacco, such as cigarettes and e-cigarettes. If you need help quitting, ask your health care provider.  · Keep all follow-up visits as told by your health care provider.  This is important.  Contact a health care provider if:  · Your periods are late, irregular, or painful, or they stop.  · You have pelvic pain that does not go away.  · You have pressure on your bladder or trouble emptying your bladder completely.  · You have pain during sex.  · You have any of the following in your abdomen:  ? A feeling of fullness.  ? Pressure.  ? Discomfort.  ? Pain that does not go away.  ? Swelling.  · You feel generally ill.  · You become constipated.  · You lose your appetite.  · You develop severe acne.  · You start to have more body hair and facial hair.  · You are gaining weight or losing weight without changing your exercise and eating habits.  · You think you may be pregnant.  Get help right away if:  · You have abdominal pain that is severe or gets worse.  · You cannot eat or drink without vomiting.  · You suddenly develop a fever.  · Your menstrual period is much heavier than usual.  This information is not intended to replace advice given to you by your health care provider. Make sure you discuss any questions you have with your health care provider.  Document Released: 07/03/2005 Document Revised: 01/21/2016 Document Reviewed: 12/05/2015  Elsevier Interactive Patient Education © 2019 [employer]

## 2018-07-24 NOTE — Progress Notes (Signed)
Here to discuss pelvic ultrasound findings, and desires to watch for worsening pain before treating cyst. Ok to take ibuprofen as needed (painful after exercise only to date).  Melody Shambley,CNM

## 2019-01-31 ENCOUNTER — Other Ambulatory Visit: Payer: Self-pay | Admitting: Obstetrics and Gynecology

## 2019-01-31 ENCOUNTER — Other Ambulatory Visit (HOSPITAL_COMMUNITY)
Admission: RE | Admit: 2019-01-31 | Discharge: 2019-01-31 | Disposition: A | Discharge status: Home / Self Care | Payer: 59 | Source: Ambulatory Visit | Attending: Obstetrics and Gynecology | Admitting: Obstetrics and Gynecology

## 2019-01-31 ENCOUNTER — Other Ambulatory Visit: Payer: Self-pay

## 2019-01-31 ENCOUNTER — Encounter: Payer: Self-pay | Admitting: Obstetrics and Gynecology

## 2019-01-31 ENCOUNTER — Ambulatory Visit (INDEPENDENT_AMBULATORY_CARE_PROVIDER_SITE_OTHER): Payer: 59 | Admitting: Obstetrics and Gynecology

## 2019-01-31 VITALS — BP 123/77 | HR 77 | Ht 64.0 in | Wt 143.1 lb

## 2019-01-31 DIAGNOSIS — B373 Candidiasis of vulva and vagina: Secondary | ICD-10-CM | POA: Diagnosis not present

## 2019-01-31 DIAGNOSIS — N72 Inflammatory disease of cervix uteri: Secondary | ICD-10-CM

## 2019-01-31 DIAGNOSIS — Z30431 Encounter for routine checking of intrauterine contraceptive device: Secondary | ICD-10-CM

## 2019-01-31 DIAGNOSIS — Z01419 Encounter for gynecological examination (general) (routine) without abnormal findings: Secondary | ICD-10-CM

## 2019-01-31 DIAGNOSIS — B3731 Acute candidiasis of vulva and vagina: Secondary | ICD-10-CM

## 2019-01-31 MED ORDER — FLUCONAZOLE 150 MG PO TABS: 150.0000 mg | ORAL_TABLET | Freq: Once | ORAL | 3 refills | Status: AC

## 2019-01-31 NOTE — Patient Instructions (Signed)
 Preventive Care 21-39 Years Old, Female Preventive care refers to visits with your health care provider and lifestyle choices that can promote health and wellness. This includes:  A yearly physical exam. This may also be called an annual well check.  Regular dental visits and eye exams.  Immunizations.  Screening for certain conditions.  Healthy lifestyle choices, such as eating a healthy diet, getting regular exercise, not using drugs or products that contain nicotine and tobacco, and limiting alcohol use. What can I expect for my preventive care visit? Physical exam Your health care provider will check your:  Height and weight. This may be used to calculate body mass index (BMI), which tells if you are at a healthy weight.  Heart rate and blood pressure.  Skin for abnormal spots. Counseling Your health care provider may ask you questions about your:  Alcohol, tobacco, and drug use.  Emotional well-being.  Home and relationship well-being.  Sexual activity.  Eating habits.  Work and work environment.  Method of birth control.  Menstrual cycle.  Pregnancy history. What immunizations do I need?  Influenza (flu) vaccine  This is recommended every year. Tetanus, diphtheria, and pertussis (Tdap) vaccine  You may need a Td booster every 10 years. Varicella (chickenpox) vaccine  You may need this if you have not been vaccinated. Human papillomavirus (HPV) vaccine  If recommended by your health care provider, you may need three doses over 6 months. Measles, mumps, and rubella (MMR) vaccine  You may need at least one dose of MMR. You may also need a second dose. Meningococcal conjugate (MenACWY) vaccine  One dose is recommended if you are age 19-21 years and a first-year college student living in a residence hall, or if you have one of several medical conditions. You may also need additional booster doses. Pneumococcal conjugate (PCV13) vaccine  You may need  this if you have certain conditions and were not previously vaccinated. Pneumococcal polysaccharide (PPSV23) vaccine  You may need one or two doses if you smoke cigarettes or if you have certain conditions. Hepatitis A vaccine  You may need this if you have certain conditions or if you travel or work in places where you may be exposed to hepatitis A. Hepatitis B vaccine  You may need this if you have certain conditions or if you travel or work in places where you may be exposed to hepatitis B. Haemophilus influenzae type b (Hib) vaccine  You may need this if you have certain conditions. You may receive vaccines as individual doses or as more than one vaccine together in one shot (combination vaccines). Talk with your health care provider about the risks and benefits of combination vaccines. What tests do I need?  Blood tests  Lipid and cholesterol levels. These may be checked every 5 years starting at age 20.  Hepatitis C test.  Hepatitis B test. Screening  Diabetes screening. This is done by checking your blood sugar (glucose) after you have not eaten for a while (fasting).  Sexually transmitted disease (STD) testing.  BRCA-related cancer screening. This may be done if you have a family history of breast, ovarian, tubal, or peritoneal cancers.  Pelvic exam and Pap test. This may be done every 3 years starting at age 21. Starting at age 30, this may be done every 5 years if you have a Pap test in combination with an HPV test. Talk with your health care provider about your test results, treatment options, and if necessary, the need for more   tests. Follow these instructions at home: Eating and drinking   Eat a diet that includes fresh fruits and vegetables, whole grains, lean protein, and low-fat dairy.  Take vitamin and mineral supplements as recommended by your health care provider.  Do not drink alcohol if: ? Your health care provider tells you not to drink. ? You are  pregnant, may be pregnant, or are planning to become pregnant.  If you drink alcohol: ? Limit how much you have to 0-1 drink a day. ? Be aware of how much alcohol is in your drink. In the [place] one drink equals one 12 oz bottle of beer (355 mL), one 5 oz glass of wine (148 mL), or one 1 oz glass of hard liquor (44 mL). Lifestyle  Take daily care of your teeth and gums.  Stay active. Exercise for at least 30 minutes on 5 or more days each week.  Do not use any products that contain nicotine or tobacco, such as cigarettes, e-cigarettes, and chewing tobacco. If you need help quitting, ask your health care provider.  If you are sexually active, practice safe sex. Use a condom or other form of birth control (contraception) in order to prevent pregnancy and STIs (sexually transmitted infections). If you plan to become pregnant, see your health care provider for a preconception visit. What's next?  Visit your health care provider once a year for a well check visit.  Ask your health care provider how often you should have your eyes and teeth checked.  Stay up to date on all vaccines. This information is not intended to replace advice given to you by your health care provider. Make sure you discuss any questions you have with your health care provider. Document Released: 08/29/2001 Document Revised: 03/14/2018 Document Reviewed: 03/14/2018 Elsevier Patient Education  2020 [employer].

## 2019-01-31 NOTE — Progress Notes (Signed)
Subjective:   Andrea Velez is a 38 y.o. G50P2002 Caucasian female here for a routine well-woman exam.  No LMP recorded. (Menstrual status: IUD).    Current complaints: still having occasional ovary pains since January not worse and is intermittent. PCP: me       does desire labs  Social History: Sexual: heterosexual Marital Status: married Living situation: with family Occupation: unemployed Tobacco/alcohol: no tobacco use Illicit drugs: no history of illicit drug use  The following portions of the patient's history were reviewed and updated as appropriate: allergies, current medications, past family history, past medical history, past social history, past surgical history and problem list.  Past Medical History Past Medical History:  Diagnosis Date  . Anxiety   . Vitamin D deficiency     Past Surgical History Past Surgical History:  Procedure Laterality Date  . CHOLECYSTECTOMY  2009    Gynecologic History G2P2002  No LMP recorded. (Menstrual status: IUD). Contraception: IUD Last Pap: 2018. Results were: normal Last mammogram: 2018. Results were: normal   Obstetric History OB History  Gravida Para Term Preterm AB Living  2 2 2     2   SAB TAB Ectopic Multiple Live Births          2    # Outcome Date GA Lbr Len/2nd Weight Sex Delivery Anes PTL Lv  2 Term 2005   7 lb 14 oz (3.572 kg) F Vag-Spont   LIV  1 Term 2002   7 lb 14 oz (3.572 kg) F Vag-Spont   LIV    Current Medications Current Outpatient Medications on File Prior to Visit  Medication Sig Dispense Refill  . levonorgestrel (MIRENA) 20 MCG/24HR IUD 1 each by Intrauterine route once.    . Multiple Vitamins-Minerals (MULTIVITAMIN WITH MINERALS) tablet Take 1 tablet by mouth daily.    . Vitamin D, Ergocalciferol, (DRISDOL) 50000 units CAPS capsule Take 1 capsule (50,000 Units total) by mouth once a week. (Patient not taking: Reported on 01/31/2019) 30 capsule 4   No current facility-administered medications  on file prior to visit.     Review of Systems Patient denies any headaches, blurred vision, shortness of breath, chest pain, abdominal pain, problems with bowel movements, urination, or intercourse.  Objective:  BP 123/77   Pulse 77   Ht 5\' 4"  (1.626 m)   Wt 143 lb 1.6 oz (64.9 kg)   BMI 24.56 kg/m  Physical Exam  General:  Well developed, well nourished, no acute distress. She is alert and oriented x3. Skin:  Warm and dry Neck:  Midline trachea, no thyromegaly or nodules Cardiovascular: Regular rate and rhythm, no murmur heard Lungs:  Effort normal, all lung fields clear to auscultation bilaterally Breasts:  No dominant palpable mass, retraction, or nipple discharge Abdomen:  Soft, non tender, no hepatosplenomegaly or masses Pelvic:  External genitalia is normal in appearance.  The vagina is normal in appearance. The cervix is bulbous, no CMT with scattered lesions around margin and increased white discharge.  Thin prep pap is done. IUD string noted.  Microscopic wet-mount exam shows hyphae, lactobacilli, white blood cells.Uterus is felt to be normal size, shape, and contour.  No adnexal masses or tenderness noted. Extremities:  No swelling or varicosities noted Psych:  She has a normal mood and affect  Assessment:   Healthy well-woman exam IUD check Yeast infection Cervicitis  Plan:  Will consider repeating ultrasound to check ovarian cyst- will call if desires it in the future.  rx for diflucan sent  in and instructed on use. F/U 1 year for AE, or sooner if needed   Melody Suzan NailerN Shambley, CNM

## 2019-02-01 LAB — COMPREHENSIVE METABOLIC PANEL
ALT: 23 IU/L (ref 0–32)
AST: 27 IU/L (ref 0–40)
Albumin/Globulin Ratio: 2.2 (ref 1.2–2.2)
Albumin: 4.9 g/dL — ABNORMAL HIGH (ref 3.8–4.8)
Alkaline Phosphatase: 64 IU/L (ref 39–117)
BUN/Creatinine Ratio: 18 (ref 9–23)
BUN: 12 mg/dL (ref 6–20)
Bilirubin Total: 0.6 mg/dL (ref 0.0–1.2)
CO2: 22 mmol/L (ref 20–29)
Calcium: 9.1 mg/dL (ref 8.7–10.2)
Chloride: 102 mmol/L (ref 96–106)
Creatinine, Ser: 0.68 mg/dL (ref 0.57–1.00)
GFR calc Af Amer: 127 mL/min/{1.73_m2} (ref >59)
GFR calc non Af Amer: 111 mL/min/{1.73_m2} (ref >59)
Globulin, Total: 2.2 g/dL (ref 1.5–4.5)
Glucose: 87 mg/dL (ref 65–99)
Potassium: 4.8 mmol/L (ref 3.5–5.2)
Sodium: 140 mmol/L (ref 134–144)
Total Protein: 7.1 g/dL (ref 6.0–8.5)

## 2019-02-01 LAB — LIPID PANEL
Chol/HDL Ratio: 3 ratio (ref 0.0–4.4)
Cholesterol, Total: 201 mg/dL — ABNORMAL HIGH (ref 100–199)
HDL: 66 mg/dL (ref >39)
LDL Calculated: 115 mg/dL — ABNORMAL HIGH (ref 0–99)
Triglycerides: 101 mg/dL (ref 0–149)
VLDL Cholesterol Cal: 20 mg/dL (ref 5–40)

## 2019-02-01 LAB — HEMOGLOBIN A1C
Est. average glucose Bld gHb Est-mCnc: 103 mg/dL
Hgb A1c MFr Bld: 5.2 % (ref 4.8–5.6)

## 2019-02-04 LAB — CYTOLOGY - PAP
Chlamydia: NEGATIVE
Diagnosis: NEGATIVE
Neisseria Gonorrhea: NEGATIVE

## 2019-02-06 LAB — CERVICOVAGINAL ANCILLARY ONLY: Herpes: NEGATIVE

## 2019-10-05 ENCOUNTER — Ambulatory Visit: Payer: 59 | Attending: Internal Medicine

## 2019-10-05 DIAGNOSIS — Z23 Encounter for immunization: Secondary | ICD-10-CM

## 2019-10-05 NOTE — Progress Notes (Signed)
   Covid-19 Vaccination Clinic  Name:  BREN BORYS    MRN: [id number] DOB: 18-Sep-1980  10/05/2019  Ms. Merk was observed post Covid-19 immunization for 15 minutes without incident. She was provided with Vaccine Information Sheet and instruction to access the V-Safe system.   Ms. Holts was instructed to call 911 with any severe reactions post vaccine: Marland Kitchen Difficulty breathing  . Swelling of face and throat  . A fast heartbeat  . A bad rash all over body  . Dizziness and weakness   Immunizations Administered    Name Date Dose VIS Date Route   Pfizer COVID-19 Vaccine 10/05/2019  3:43 PM 0.3 mL 06/27/2019 Intramuscular   Manufacturer: [employer], [employer]   Lot: [id number]   NDC: [address]-8

## 2019-10-26 ENCOUNTER — Ambulatory Visit: Payer: 59 | Attending: Internal Medicine

## 2019-10-26 DIAGNOSIS — Z23 Encounter for immunization: Secondary | ICD-10-CM

## 2019-10-26 NOTE — Progress Notes (Signed)
   Covid-19 Vaccination Clinic  Name:  Andrea Velez    MRN: [id number] DOB: 06-05-1981  10/26/2019  [name] was observed post Covid-19 immunization for 15 minutes without incident. She was provided with Vaccine Information Sheet and instruction to access the V-Safe system.   [name] was instructed to call 911 with any severe reactions post vaccine: Marland Kitchen Difficulty breathing  . Swelling of face and throat  . A fast heartbeat  . A bad rash all over body  . Dizziness and weakness   Immunizations Administered    Name Date Dose VIS Date Route   Pfizer COVID-19 Vaccine 10/26/2019  3:12 PM 0.3 mL 06/27/2019 Intramuscular   Manufacturer: [employer], [employer]   Lot: [id number]   NDC: [address]-6

## 2020-02-06 ENCOUNTER — Encounter: Payer: 59 | Admitting: Certified Nurse Midwife

## 2020-02-13 ENCOUNTER — Ambulatory Visit (INDEPENDENT_AMBULATORY_CARE_PROVIDER_SITE_OTHER): Payer: 59 | Admitting: Certified Nurse Midwife

## 2020-02-13 ENCOUNTER — Encounter: Payer: Self-pay | Admitting: Certified Nurse Midwife

## 2020-02-13 VITALS — BP 122/81 | HR 72 | Ht 64.0 in | Wt 144.0 lb

## 2020-02-13 DIAGNOSIS — Z833 Family history of diabetes mellitus: Secondary | ICD-10-CM

## 2020-02-13 DIAGNOSIS — Z1231 Encounter for screening mammogram for malignant neoplasm of breast: Secondary | ICD-10-CM

## 2020-02-13 DIAGNOSIS — Z01419 Encounter for gynecological examination (general) (routine) without abnormal findings: Secondary | ICD-10-CM | POA: Diagnosis not present

## 2020-02-13 DIAGNOSIS — E559 Vitamin D deficiency, unspecified: Secondary | ICD-10-CM

## 2020-02-13 DIAGNOSIS — R635 Abnormal weight gain: Secondary | ICD-10-CM | POA: Diagnosis not present

## 2020-02-13 DIAGNOSIS — Z975 Presence of (intrauterine) contraceptive device: Secondary | ICD-10-CM | POA: Diagnosis not present

## 2020-02-13 DIAGNOSIS — E7800 Pure hypercholesterolemia, unspecified: Secondary | ICD-10-CM

## 2020-02-13 NOTE — Patient Instructions (Signed)
Levonorgestrel intrauterine device (IUD) What is this medicine? LEVONORGESTREL IUD (LEE voe nor jes trel) is a contraceptive (birth control) device. The device is placed inside the uterus by a healthcare professional. It is used to prevent pregnancy. This device can also be used to treat heavy bleeding that occurs during your period. This medicine may be used for other purposes; ask your health care provider or pharmacist if you have questions. COMMON BRAND NAME(S): [name] What should I tell my health care provider before I take this medicine? They need to know if you have any of these conditions:  abnormal Pap smear  cancer of the breast, uterus, or cervix  diabetes  endometritis  genital or pelvic infection now or in the past  have more than one sexual partner or your partner has more than one partner  heart disease  history of an ectopic or tubal pregnancy  immune system problems  IUD in place  liver disease or tumor  problems with blood clots or take blood-thinners  seizures  use intravenous drugs  uterus of unusual shape  vaginal bleeding that has not been explained  an unusual or allergic reaction to levonorgestrel, other hormones, silicone, or polyethylene, medicines, foods, dyes, or preservatives  pregnant or trying to get pregnant  breast-feeding How should I use this medicine? This device is placed inside the uterus by a health care professional. Talk to your pediatrician regarding the use of this medicine in children. Special care may be needed. Overdosage: If you think you have taken too much of this medicine contact a poison control center or emergency room at once. NOTE: This medicine is only for you. Do not share this medicine with others. What if I miss a dose? This does not apply. Depending on the brand of device you have inserted, the device will need to be replaced every 3 to 6 years if you wish to continue using this type  of birth control. What may interact with this medicine? Do not take this medicine with any of the following medications:  amprenavir  bosentan  fosamprenavir This medicine may also interact with the following medications:  aprepitant  armodafinil  barbiturate medicines for inducing sleep or treating seizures  bexarotene  boceprevir  griseofulvin  medicines to treat seizures like carbamazepine, ethotoin, felbamate, oxcarbazepine, phenytoin, topiramate  modafinil  pioglitazone  rifabutin  rifampin  rifapentine  some medicines to treat HIV infection like atazanavir, efavirenz, indinavir, lopinavir, nelfinavir, tipranavir, ritonavir  St. John's wort  warfarin This list may not describe all possible interactions. Give your health care provider a list of all the medicines, herbs, non-prescription drugs, or dietary supplements you use. Also tell them if you smoke, drink alcohol, or use illegal drugs. Some items may interact with your medicine. What should I watch for while using this medicine? Visit your doctor or health care professional for regular check ups. See your doctor if you or your partner has sexual contact with others, becomes HIV positive, or gets a sexual transmitted disease. This product does not protect you against HIV infection (AIDS) or other sexually transmitted diseases. You can check the placement of the IUD yourself by reaching up to the top of your vagina with clean fingers to feel the threads. Do not pull on the threads. It is a good habit to check placement after each menstrual period. Call your doctor right away if you feel more of the IUD than just the threads or if you cannot feel the threads at  all. The IUD may come out by itself. You may become pregnant if the device comes out. If you notice that the IUD has come out use a backup birth control method like condoms and call your health care provider. Using tampons will not change the position of the  IUD and are okay to use during your period. This IUD can be safely scanned with magnetic resonance imaging (MRI) only under specific conditions. Before you have an MRI, tell your healthcare provider that you have an IUD in place, and which type of IUD you have in place. What side effects may I notice from receiving this medicine? Side effects that you should report to your doctor or health care professional as soon as possible:  allergic reactions like skin rash, itching or hives, swelling of the face, lips, or tongue  fever, flu-like symptoms  genital sores  high blood pressure  no menstrual period for 6 weeks during use  pain, swelling, warmth in the leg  pelvic pain or tenderness  severe or sudden headache  signs of pregnancy  stomach cramping  sudden shortness of breath  trouble with balance, talking, or walking  unusual vaginal bleeding, discharge  yellowing of the eyes or skin Side effects that usually do not require medical attention (report to your doctor or health care professional if they continue or are bothersome):  acne  breast pain  change in sex drive or performance  changes in weight  cramping, dizziness, or faintness while the device is being inserted  headache  irregular menstrual bleeding within first 3 to 6 months of use  nausea This list may not describe all possible side effects. Call your doctor for medical advice about side effects. You may report side effects to FDA at 1-800-FDA-1088. Where should I keep my medicine? This does not apply. NOTE: This sheet is a summary. It may not cover all possible information. If you have questions about this medicine, talk to your doctor, pharmacist, or health care provider.  2020 Elsevier/Gold Standard (2018-05-14 13:22:01)   Preventive Care 55-34 Years Old, Female Preventive care refers to visits with your health care provider and lifestyle choices that can promote health and wellness. This  includes:  A yearly physical exam. This may also be called an annual well check.  Regular dental visits and eye exams.  Immunizations.  Screening for certain conditions.  Healthy lifestyle choices, such as eating a healthy diet, getting regular exercise, not using drugs or products that contain nicotine and tobacco, and limiting alcohol use. What can I expect for my preventive care visit? Physical exam Your health care provider will check your:  Height and weight. This may be used to calculate body mass index (BMI), which tells if you are at a healthy weight.  Heart rate and blood pressure.  Skin for abnormal spots. Counseling Your health care provider may ask you questions about your:  Alcohol, tobacco, and drug use.  Emotional well-being.  Home and relationship well-being.  Sexual activity.  Eating habits.  Work and work Statistician.  Method of birth control.  Menstrual cycle.  Pregnancy history. What immunizations do I need?  Influenza (flu) vaccine  This is recommended every year. Tetanus, diphtheria, and pertussis (Tdap) vaccine  You may need a Td booster every 10 years. Varicella (chickenpox) vaccine  You may need this if you have not been vaccinated. Zoster (shingles) vaccine  You may need this after age 61. Measles, mumps, and rubella (MMR) vaccine  You may need at least  one dose of MMR if you were born in 1957 or later. You may also need a second dose. Pneumococcal conjugate (PCV13) vaccine  You may need this if you have certain conditions and were not previously vaccinated. Pneumococcal polysaccharide (PPSV23) vaccine  You may need one or two doses if you smoke cigarettes or if you have certain conditions. Meningococcal conjugate (MenACWY) vaccine  You may need this if you have certain conditions. Hepatitis A vaccine  You may need this if you have certain conditions or if you travel or work in places where you may be exposed to hepatitis  A. Hepatitis B vaccine  You may need this if you have certain conditions or if you travel or work in places where you may be exposed to hepatitis B. Haemophilus influenzae type b (Hib) vaccine  You may need this if you have certain conditions. Human papillomavirus (HPV) vaccine  If recommended by your health care provider, you may need three doses over 6 months. You may receive vaccines as individual doses or as more than one vaccine together in one shot (combination vaccines). Talk with your health care provider about the risks and benefits of combination vaccines. What tests do I need? Blood tests  Lipid and cholesterol levels. These may be checked every 5 years, or more frequently if you are over 41 years old.  Hepatitis C test.  Hepatitis B test. Screening  Lung cancer screening. You may have this screening every year starting at age 78 if you have a 30-pack-year history of smoking and currently smoke or have quit within the past 15 years.  Colorectal cancer screening. All adults should have this screening starting at age 81 and continuing until age 38. Your health care provider may recommend screening at age 78 if you are at increased risk. You will have tests every 1-10 years, depending on your results and the type of screening test.  Diabetes screening. This is done by checking your blood sugar (glucose) after you have not eaten for a while (fasting). You may have this done every 1-3 years.  Mammogram. This may be done every 1-2 years. Talk with your health care provider about when you should start having regular mammograms. This may depend on whether you have a family history of breast cancer.  BRCA-related cancer screening. This may be done if you have a family history of breast, ovarian, tubal, or peritoneal cancers.  Pelvic exam and Pap test. This may be done every 3 years starting at age 46. Starting at age 4, this may be done every 5 years if you have a Pap test in  combination with an HPV test. Other tests  Sexually transmitted disease (STD) testing.  Bone density scan. This is done to screen for osteoporosis. You may have this scan if you are at high risk for osteoporosis. Follow these instructions at home: Eating and drinking  Eat a diet that includes fresh fruits and vegetables, whole grains, lean protein, and low-fat dairy.  Take vitamin and mineral supplements as recommended by your health care provider.  Do not drink alcohol if: ? Your health care provider tells you not to drink. ? You are pregnant, may be pregnant, or are planning to become pregnant.  If you drink alcohol: ? Limit how much you have to 0-1 drink a day. ? Be aware of how much alcohol is in your drink. In the [place] one drink equals one 12 oz bottle of beer (355 mL), one 5 oz glass of wine (148  mL), or one 1 oz glass of hard liquor (44 mL). Lifestyle  Take daily care of your teeth and gums.  Stay active. Exercise for at least 30 minutes on 5 or more days each week.  Do not use any products that contain nicotine or tobacco, such as cigarettes, e-cigarettes, and chewing tobacco. If you need help quitting, ask your health care provider.  If you are sexually active, practice safe sex. Use a condom or other form of birth control (contraception) in order to prevent pregnancy and STIs (sexually transmitted infections).  If told by your health care provider, take low-dose aspirin daily starting at age 36. What's next?  Visit your health care provider once a year for a well check visit.  Ask your health care provider how often you should have your eyes and teeth checked.  Stay up to date on all vaccines. This information is not intended to replace advice given to you by your health care provider. Make sure you discuss any questions you have with your health care provider. Document Revised: 03/14/2018 Document Reviewed: 03/14/2018 Elsevier Patient Education  2020 [employer].

## 2020-02-13 NOTE — Progress Notes (Signed)
Pt here today for her annual. She wants to discuss weight loss management. She is also due for a mammogram.

## 2020-02-14 LAB — ESTRADIOL: Estradiol: 32.8 pg/mL

## 2020-02-14 LAB — LIPID PANEL
Chol/HDL Ratio: 3.2 ratio (ref 0.0–4.4)
Cholesterol, Total: 215 mg/dL — ABNORMAL HIGH (ref 100–199)
HDL: 68 mg/dL (ref >39)
LDL Chol Calc (NIH): 131 mg/dL — ABNORMAL HIGH (ref 0–99)
Triglycerides: 93 mg/dL (ref 0–149)
VLDL Cholesterol Cal: 16 mg/dL (ref 5–40)

## 2020-02-14 LAB — FSH/LH
FSH: 5.3 m[IU]/mL
LH: 5.2 m[IU]/mL

## 2020-02-14 LAB — TSH+T4F+T3FREE
Free T4: 1.34 ng/dL (ref 0.82–1.77)
T3, Free: 3.2 pg/mL (ref 2.0–4.4)
TSH: 1.44 u[IU]/mL (ref 0.450–4.500)

## 2020-02-14 LAB — VITAMIN D 25 HYDROXY (VIT D DEFICIENCY, FRACTURES): Vit D, 25-Hydroxy: 50 ng/mL (ref 30.0–100.0)

## 2020-02-14 LAB — HEMOGLOBIN A1C
Est. average glucose Bld gHb Est-mCnc: 100 mg/dL
Hgb A1c MFr Bld: 5.1 % (ref 4.8–5.6)

## 2020-02-15 NOTE — Progress Notes (Signed)
ANNUAL PREVENTATIVE CARE GYN  ENCOUNTER NOTE  Subjective:       Andrea Velez is a 39 y.o. G40P2002 female here for a routine annual gynecologic exam.  Current complaints: 1.  Weight gain 2.  Needs screening mammogram  Denies difficulty breathing or respiratory distress, chest pain, abdominal pain, excessive vaginal bleeding, dysuria, and leg pain or swelling.    Gynecologic History  No LMP recorded. (Menstrual status: IUD).  Contraception: IUD  Last Pap: 01/2019. Results were: Negative (12/2016: Neg/Neg)  Last mammogram: 01/2017. Results were: normal  Obstetric History  OB History  Gravida Para Term Preterm AB Living  2 2 2     2   SAB TAB Ectopic Multiple Live Births          2    # Outcome Date GA Lbr Len/2nd Weight Sex Delivery Anes PTL Lv  2 Term 2005   7 lb 14 oz (3.572 kg) F Vag-Spont   LIV  1 Term 2002   7 lb 14 oz (3.572 kg) F Vag-Spont   LIV    Past Medical History:  Diagnosis Date  . Anxiety   . Vitamin D deficiency     Past Surgical History:  Procedure Laterality Date  . CHOLECYSTECTOMY  2009    Current Outpatient Medications on File Prior to Visit  Medication Sig Dispense Refill  . levonorgestrel (MIRENA) 20 MCG/24HR IUD 1 each by Intrauterine route once.    . Multiple Vitamins-Minerals (MULTIVITAMIN WITH MINERALS) tablet Take 1 tablet by mouth daily.    . Vitamin D, Ergocalciferol, (DRISDOL) 1.25 MG (50000 UT) CAPS capsule TAKE ONE CAPSULE BY MOUTH EVERY WEEK (Patient not taking: Reported on 02/13/2020) 30 capsule 4   No current facility-administered medications on file prior to visit.    No Known Allergies  Social History   Socioeconomic History  . Marital status: Married    Spouse name: Not on file  . Number of children: Not on file  . Years of education: Not on file  . Highest education level: Not on file  Occupational History  . Not on file  Tobacco Use  . Smoking status: Never Smoker  . Smokeless tobacco: Never Used  Vaping Use   . Vaping Use: Never used  Substance and Sexual Activity  . Alcohol use: Yes    Alcohol/week: 1.0 - 2.0 standard drink    Types: 1 - 2 Glasses of wine per week    Comment: weekly  . Drug use: No  . Sexual activity: Yes    Birth control/protection: I.U.D.    Comment: Mirena  Other Topics Concern  . Not on file  Social History Narrative  . Not on file   Social Determinants of Health   Financial Resource Strain:   . Difficulty of Paying Living Expenses:   Food Insecurity:   . Worried About 02/15/2020 in the Last Year:   . Programme researcher, broadcasting/film/video in the Last Year:   Transportation Needs:   . Barista (Medical):   Freight forwarder Lack of Transportation (Non-Medical):   Physical Activity:   . Days of Exercise per Week:   . Minutes of Exercise per Session:   Stress:   . Feeling of Stress :   Social Connections:   . Frequency of Communication with Friends and Family:   . Frequency of Social Gatherings with Friends and Family:   . Attends Religious Services:   . Active Member of Clubs or Organizations:   . Attends Club  or Organization Meetings:   Marland Kitchen Marital Status:   Intimate Partner Violence:   . Fear of Current or Ex-Partner:   . Emotionally Abused:   Marland Kitchen Physically Abused:   . Sexually Abused:     Family History  Problem Relation Age of Onset  . Diabetes Maternal Grandmother   . Cancer Paternal Aunt        breast  . Cancer Paternal Grandfather        colon  . Breast cancer Neg Hx   . Ovarian cancer Neg Hx   . Colon cancer Neg Hx     The following portions of the patient's history were reviewed and updated as appropriate: allergies, current medications, past family history, past medical history, past social history, past surgical history and problem list.  Review of Systems  ROS negative except as noted above. Information obtained from patient.    Objective:   BP 122/81   Pulse 72   Ht 5\' 4"  (1.626 m)   Wt 144 lb (65.3 kg)   BMI 24.72 kg/m     CONSTITUTIONAL: Well-developed, well-nourished female in no acute distress.   PSYCHIATRIC: Normal mood and affect. Normal behavior. Normal judgment and thought content.  NEUROLGIC: Alert and oriented to person, place, and time. Normal muscle tone coordination. No cranial nerve deficit noted.  HENT:  Normocephalic, atraumatic, External right and left ear normal.   EYES: Conjunctivae and EOM are normal. Pupils are equal and round.    NECK: Normal range of motion, supple, no masses.  Normal thyroid.   SKIN: Skin is warm and dry. No rash noted. Not diaphoretic. No erythema. No pallor.  CARDIOVASCULAR: Normal heart rate noted, regular rhythm, no murmur.  RESPIRATORY: Clear to auscultation bilaterally. Effort and breath sounds normal, no problems with respiration noted.  BREASTS: Symmetric in size. No masses, skin changes, nipple drainage, or lymphadenopathy.  ABDOMEN: Soft, normal bowel sounds, no distention noted.  No tenderness, rebound or guarding.   PELVIC:  External Genitalia: Normal  Vagina: Normal  Cervix: Normal, IUD string present  Uterus: Normal  Adnexa: Normal  MUSCULOSKELETAL: Normal range of motion. No tenderness.  No cyanosis, clubbing, or edema.  2+ distal pulses.  LYMPHATIC: No Axillary, Supraclavicular, or Inguinal Adenopathy.  Assessment:   Annual gynecologic examination 39 y.o.   Contraception: IUD   Normal BMI   Problem List Items Addressed This Visit    None    Visit Diagnoses    Well woman exam    -  Primary   Relevant Orders   TSH+T4F+T3Free (Completed)   Estradiol (Completed)   FSH/LH (Completed)   VITAMIN D 25 Hydroxy (Vit-D Deficiency, Fractures) (Completed)   Lipid panel (Completed)   Hemoglobin A1c (Completed)   MM 3D SCREEN BREAST BILATERAL   IUD (intrauterine device) in place       Family history of diabetes mellitus       Relevant Orders   Lipid panel (Completed)   Hemoglobin A1c (Completed)   Weight gain       Relevant Orders    TSH+T4F+T3Free (Completed)   Estradiol (Completed)   FSH/LH (Completed)   Lipid panel (Completed)   Vitamin D deficiency, unspecified       Relevant Orders   VITAMIN D 25 Hydroxy (Vit-D Deficiency, Fractures) (Completed)   Elevated cholesterol       Relevant Orders   Lipid panel (Completed)   Screening mammogram, encounter for       Relevant Orders   MM 3D SCREEN BREAST BILATERAL  Plan:   Pap: Not needed  Mammogram: Ordered  Labs: See orders  Routine preventative health maintenance measures emphasized: Exercise/Diet/Weight control, Tobacco Warnings, Alcohol/Substance use risks and Stress Management; see AVS  Reviewed red flag symptoms and when to call  Return to Clinic - 1 Year for [facility] or sooner if needed   Serafina Royals, CNM  [facility], [facility]

## 2020-03-01 ENCOUNTER — Other Ambulatory Visit: Payer: Self-pay

## 2020-03-01 DIAGNOSIS — E7800 Pure hypercholesterolemia, unspecified: Secondary | ICD-10-CM

## 2020-03-01 NOTE — Progress Notes (Signed)
Referral to MNT, see orders.   Andrea Velez, [facility] 03/01/20 2:57 PM

## 2020-04-02 ENCOUNTER — Other Ambulatory Visit: Payer: Self-pay

## 2020-04-02 ENCOUNTER — Encounter: Payer: 59 | Attending: Certified Nurse Midwife | Admitting: Dietician

## 2020-04-02 VITALS — Ht 64.0 in | Wt 145.9 lb

## 2020-04-02 DIAGNOSIS — E7800 Pure hypercholesterolemia, unspecified: Secondary | ICD-10-CM | POA: Diagnosis not present

## 2020-04-02 NOTE — Patient Instructions (Addendum)
   Try sunflower butter and dannon light and fit greek  Recipes- [email or web address], [email or web address]  Increase water intake to 64 oz day

## 2020-04-02 NOTE — Progress Notes (Signed)
Medical Nutrition Therapy: Visit start time: 1330  end time: 1500  Assessment:  Diagnosis: elevated cholesterol Past medical history: cholecystectomy, IBS Psychosocial issues/ stress concerns: pt rates stress level as "moderate" and feels "very well" about stress management skills   Preferred learning method:  . Auditory . Visual . Hands-on  Current weight: 145.9 lbs  Height: 5'4" Medications, supplements: reconciled in medical record  Labs- total cholesterol 215(H), LDL 131(H)  Progress and evaluation:   Pt works full time from home   8:16 time restricted intermittent fasting- pt reports less boredom snacking    Pt reports not eating fried foods and cooking most meals at home   Pt reports beans, some grains, and peanut butter cause GI upset   Most proteins are lean meats, egg whites.    Pt reports cooking with avocado oil and using butter in moderation.    Pt does not report frequent cheese intake.   Physical activity: 30-60 min of stationary bike, barre, or strength training, 5-6 days a week  Dietary Intake:  Usual eating pattern includes 2-3 meals and 1-2 snacks per day. Dining out frequency: 0-1 meals per week.  Breakfast/Snack (11am): multigrain 2 toast with avocado and everything seasoning and egg whites   Lunch/Snack: chobani flip yogurt; peanut butter and apples; hummus with pretzel crisps; leftovers Supper (7pm): chicken apple sausage with broccoli and carrots and potatoes/sweet potatoes; spaghetti with meatballs; brown rice/quinoa  Beverages: coffee with creamer, sparkling water, water   Diet contains good amount of fruits and vegetables and lean proteins.  Low intake of whole grains.   Nutrition Care Education: Basic nutrition: basic food groups, appropriate nutrient balance, appropriate meal and snack schedule, general nutrition guidelines    Weight control: identifying healthy weight, determining reasonable weight loss rate, importance of low sugar and low  fat choices, portion control strategies Advanced nutrition:  recipe modification, cooking techniques, food label reading Hyperlipidemia:  healthy and unhealthy fats, role of fiber, plant sterols, role of exercise  Nutritional Diagnosis:  Momence-2.2 Altered nutrition-related laboratory As related to elevated cholesterol diagnosis.  As evidenced by total cholesterol of 215 and LDL of 131.  Intervention:  Discussion and instruction as noted above. Pt understands importance of moderating saturated fat intake.  Reviewed several sustainable changes that may help with lowering cholesterol levels. Established goals for additional change.    Maintain physical activity   150 minutes per week is recommendation    Increase fiber intake   Eat at least 3 servings of whole grains a day  Try smoothies and/or oatmeal for breakfast  Continue fruit and vegetable intake, 5+ servings/day  Increase fluid intake   Drink at least 64oz water daily    Decrease saturated fat intake   Try more plant-based sources of protein  Limit processed meats   Switch to low fat dairy products   Education Materials given:  Marland Kitchen Mediterranean diet handout  . Recipes- [email or web address], [email or web address] . Goals/ instructions  Learner/ who was taught:  . Patient   Level of understanding: Marland Kitchen Verbalizes/ demonstrates competency  Demonstrated degree of understanding via:   Teach back Learning barriers: . None  Willingness to learn/ readiness for change: . Eager, change in progress  Monitoring and Evaluation:  Dietary intake, exercise, lipid panel, and body weight      follow up: October 15 at 4p

## 2020-04-30 ENCOUNTER — Ambulatory Visit: Payer: 59 | Admitting: Dietician

## 2020-05-21 ENCOUNTER — Other Ambulatory Visit: Payer: Self-pay

## 2020-05-21 MED ORDER — FLUCONAZOLE 150 MG PO TABS
150.0000 mg | ORAL_TABLET | Freq: Once | ORAL | 0 refills | Status: AC
Start: 2020-05-21 — End: 2020-05-21

## 2020-10-27 ENCOUNTER — Ambulatory Visit
Admission: RE | Admit: 2020-10-27 | Discharge: 2020-10-27 | Disposition: A | Discharge status: Home / Self Care | Payer: 59 | Source: Ambulatory Visit | Attending: Certified Nurse Midwife | Admitting: Certified Nurse Midwife

## 2020-10-27 ENCOUNTER — Other Ambulatory Visit: Payer: Self-pay

## 2020-10-27 DIAGNOSIS — Z1231 Encounter for screening mammogram for malignant neoplasm of breast: Secondary | ICD-10-CM | POA: Diagnosis present

## 2020-10-27 DIAGNOSIS — Z01419 Encounter for gynecological examination (general) (routine) without abnormal findings: Secondary | ICD-10-CM

## 2020-11-03 ENCOUNTER — Ambulatory Visit
Admission: EM | Admit: 2020-11-03 | Discharge: 2020-11-03 | Disposition: A | Discharge status: Home / Self Care | Payer: 59 | Attending: Family Medicine | Admitting: Family Medicine

## 2020-11-03 ENCOUNTER — Other Ambulatory Visit: Payer: Self-pay

## 2020-11-03 ENCOUNTER — Encounter: Payer: Self-pay | Admitting: Emergency Medicine

## 2020-11-03 DIAGNOSIS — Z20822 Contact with and (suspected) exposure to covid-19: Secondary | ICD-10-CM | POA: Diagnosis not present

## 2020-11-03 DIAGNOSIS — R0981 Nasal congestion: Secondary | ICD-10-CM | POA: Diagnosis not present

## 2020-11-03 DIAGNOSIS — J069 Acute upper respiratory infection, unspecified: Secondary | ICD-10-CM | POA: Insufficient documentation

## 2020-11-03 DIAGNOSIS — J029 Acute pharyngitis, unspecified: Secondary | ICD-10-CM | POA: Diagnosis not present

## 2020-11-03 HISTORY — DX: Other seasonal allergic rhinitis: J30.2

## 2020-11-03 LAB — GROUP A STREP BY PCR: Group A Strep by PCR: NOT DETECTED

## 2020-11-03 LAB — SARS CORONAVIRUS 2 (TAT 6-24 HRS): SARS Coronavirus 2: NEGATIVE

## 2020-11-03 MED ORDER — LIDOCAINE VISCOUS HCL 2 % MT SOLN: 15.0000 mL | OROMUCOSAL | 0 refills | Status: AC | PRN

## 2020-11-03 MED ORDER — PROMETHAZINE-DM 6.25-15 MG/5ML PO SYRP: 5.0000 mL | ORAL_SOLUTION | Freq: Four times a day (QID) | ORAL | 0 refills | Status: DC | PRN

## 2020-11-03 NOTE — ED Triage Notes (Signed)
Patient in today c/o sore throat, sinus drainage and pressure x 2 days. Patient denies fever. Patient has taken OTC cold/flu. Patient has had the covid vaccines and booster. Patient did an at home covid test yesterday and it was negative.

## 2020-11-03 NOTE — ED Provider Notes (Signed)
[facility]    CSN: [id number] Arrival date & time: 11/03/20  0954      History   Chief Complaint Chief Complaint  Patient presents with  . Sore Throat  . post nasal drip  . Facial Pain    HPI Andrea Velez is a 40 y.o. female presenting with her daughter approximately 2-day history of sinus congestion and sore throat.  Also admits to postnasal drainage.  Also notes to" tickle in her throat."  Denies any fever, fatigue, body aches, chest discomfort, breathing difficulty, abdominal pain, nausea/vomiting or diarrhea.  Patient states that she had some sick friends around [place] recently and her daughter is sick with similar symptoms.  Has taken over-the-counter DayQuil and NyQuil for symptoms with some relief.  Patient states that she feels like she can "power through her symptoms."  She has taken a COVID test at home which was negative.  Her daughters was also negative.  She has been fully vaccinated for COVID-19.  She has no other complaints or concerns.  HPI  Past Medical History:  Diagnosis Date  . Anxiety   . Seasonal allergies   . Vitamin D deficiency     Patient Active Problem List   Diagnosis Date Noted  . Pain due to intrauterine contraceptive device (IUD) (HCC) 07/05/2018  . Vitamin D deficiency 12/18/2014  . Foot pain, right 12/18/2014  . Anxiety 10/28/2014  . Environmental allergies 10/28/2014    Past Surgical History:  Procedure Laterality Date  . CHOLECYSTECTOMY  2009    OB History    Gravida  2   Para  2   Term  2   Preterm      AB      Living  2     SAB      IAB      Ectopic      Multiple      Live Births  2            Home Medications    Prior to Admission medications   Medication Sig Start Date End Date Taking? Authorizing Provider  levonorgestrel (MIRENA) 20 MCG/24HR IUD 1 each by Intrauterine route once.   Yes [provider]  lidocaine (XYLOCAINE) 2 % solution Use as directed 15  mLs in the mouth or throat every 3 (three) hours as needed for up to 5 days for mouth pain (swish and spit). 11/03/20 11/08/20 Yes Shirlee Latch, PA-C  loratadine (CLARITIN) 10 MG tablet Take 10 mg by mouth daily.   Yes [provider]  Multiple Vitamins-Minerals (MULTIVITAMIN WITH MINERALS) tablet Take 1 tablet by mouth daily.   Yes [provider]  promethazine-dextromethorphan (PROMETHAZINE-DM) 6.25-15 MG/5ML syrup Take 5 mLs by mouth 4 (four) times daily as needed for cough. 11/03/20  Yes Eusebio Friendly B, PA-C  Vitamin D, Ergocalciferol, (DRISDOL) 1.25 MG (50000 UT) CAPS capsule TAKE ONE CAPSULE BY MOUTH EVERY WEEK Patient not taking: No sig reported 01/31/19   Purcell Nails, CNM    Family History Family History  Problem Relation Age of Onset  . Diabetes Maternal Grandmother   . Cancer Paternal Aunt        breast  . Cancer Paternal Grandfather        colon  . Hypertension Mother   . Glaucoma Father   . Breast cancer Neg Hx   . Ovarian cancer Neg Hx   . Colon cancer Neg Hx     Social History  Social History   Tobacco Use  . Smoking status: Never Smoker  . Smokeless tobacco: Never Used  Vaping Use  . Vaping Use: Never used  Substance Use Topics  . Alcohol use: Yes    Alcohol/week: 1.0 - 2.0 standard drink    Types: 1 - 2 Glasses of wine per week    Comment: rare  . Drug use: No     Allergies   Patient has no known allergies.   Review of Systems Review of Systems  Constitutional: Negative for chills, diaphoresis, fatigue and fever.  HENT: Positive for congestion, rhinorrhea, sinus pressure and sore throat. Negative for ear pain.   Respiratory: Positive for cough. Negative for shortness of breath.   Gastrointestinal: Negative for abdominal pain, nausea and vomiting.  Musculoskeletal: Negative for arthralgias and myalgias.  Skin: Negative for rash.  Neurological: Negative for weakness and headaches.  Hematological: Negative for adenopathy.      Physical Exam Triage Vital Signs ED Triage Vitals  Enc Vitals Group     BP 11/03/20 1039 112/80     Pulse Rate 11/03/20 1039 96     Resp 11/03/20 1039 18     Temp 11/03/20 1039 98.9 F (37.2 C)     Temp Source 11/03/20 1039 Oral     SpO2 11/03/20 1039 100 %     Weight 11/03/20 1040 150 lb (68 kg)     Height 11/03/20 1040 5\' 4"  (1.626 m)     Head Circumference --      Peak Flow --      Pain Score 11/03/20 1040 4     Pain Loc --      Pain Edu? --      Excl. in GC? --    No data found.  Updated Vital Signs BP 112/80 (BP Location: Left Arm)   Pulse 96   Temp 98.9 F (37.2 C) (Oral)   Resp 18   Ht 5\' 4"  (1.626 m)   Wt 150 lb (68 kg)   SpO2 100%   BMI 25.75 kg/m       Physical Exam Vitals and nursing note reviewed.  Constitutional:      General: She is not in acute distress.    Appearance: Normal appearance. She is not ill-appearing or toxic-appearing.  HENT:     Head: Normocephalic and atraumatic.     Right Ear: Tympanic membrane, ear canal and external ear normal.     Left Ear: Tympanic membrane, ear canal and external ear normal.     Nose: Congestion and rhinorrhea present.     Mouth/Throat:     Mouth: Mucous membranes are moist.     Pharynx: Oropharynx is clear. Posterior oropharyngeal erythema present.     Tonsils: 1+ on the right. 1+ on the left.  Eyes:     General: No scleral icterus.       Right eye: No discharge.        Left eye: No discharge.     Conjunctiva/sclera: Conjunctivae normal.  Cardiovascular:     Rate and Rhythm: Normal rate and regular rhythm.     Heart sounds: Normal heart sounds.  Pulmonary:     Effort: Pulmonary effort is normal. No respiratory distress.     Breath sounds: Normal breath sounds.  Musculoskeletal:     Cervical back: Neck supple.  Skin:    General: Skin is dry.  Neurological:     General: No focal deficit present.     Mental Status: She is  alert. Mental status is at baseline.     Motor: No weakness.      Gait: Gait normal.  Psychiatric:        Mood and Affect: Mood normal.        Behavior: Behavior normal.        Thought Content: Thought content normal.      UC Treatments / Results  Labs (all labs ordered are listed, but only abnormal results are displayed) Labs Reviewed  GROUP A STREP BY PCR  SARS CORONAVIRUS 2 (TAT 6-24 HRS)    EKG   Radiology No results found.  Procedures Procedures (including critical care time)  Medications Ordered in UC Medications - No data to display  Initial Impression / Assessment and Plan / UC Course  I have reviewed the triage vital signs and the nursing notes.  Pertinent labs & imaging results that were available during my care of the patient were reviewed by me and considered in my medical decision making (see chart for details).   40 year old female presenting for 2-day history of congestion, facial pressure, and sore throat.  Vital signs normal and stable in the clinic.  She is overall well-appearing.  She does have some nasal congestion and slight yellow rhinorrhea on exam as well as posterior pharyngeal erythema and 1+ tonsillar enlargement.  Chest is clear to auscultation heart regular rate and rhythm.  Molecular strep testing.  Negative.  Advises likely viral illness and supportive care encouraged.  Sent Promethazine DM and viscous lidocaine.  Advised increasing rest and fluids.  COVID test obtained.  Current CDC guidelines, isolation protocol and ED precautions reviewed with patient.  Work note provided.  Advised to follow-up with our clinic for any worsening symptoms or if not better after 10 days.  Final Clinical Impressions(s) / UC Diagnoses   Final diagnoses:  Viral upper respiratory tract infection  Sore throat  Nasal congestion     Discharge Instructions     Negative strep test.  URI/COLD SYMPTOMS: Your exam today is consistent with a viral illness. Antibiotics are not indicated at this time. Use medications as  directed, including cough syrup, nasal saline, and decongestants. Your symptoms should improve over the next few days and resolve within 7-10 days. Increase rest and fluids. F/u if symptoms worsen or predominate such as sore throat, ear pain, productive cough, shortness of breath, or if you develop high fevers or worsening fatigue over the next several days.    You have received COVID testing today either for positive exposure, concerning symptoms that could be related to COVID infection, screening purposes, or re-testing after confirmed positive.  Your test obtained today checks for active viral infection in the last 1-2 weeks. If your test is negative now, you can still test positive later. So, if you do develop symptoms you should either get re-tested and/or isolate x 5 days and then strict mask use x 5 days (unvaccinated) or mask use x 10 days (vaccinated). Please follow CDC guidelines.  While Rapid antigen tests come back in 15-20 minutes, send out PCR/molecular test results typically come back within 1-3 days. In the mean time, if you are symptomatic, assume this could be a positive test and treat/monitor yourself as if you do have COVID.   We will call with test results if positive. Please download the MyChart app and set up a profile to access test results.   If symptomatic, go home and rest. Push fluids. Take Tylenol as needed for discomfort. Gargle warm salt water.  Throat lozenges. Take Mucinex DM or Robitussin for cough. Humidifier in bedroom to ease coughing. Warm showers. Also review the COVID handout for more information.  COVID-19 INFECTION: The incubation period of COVID-19 is approximately 14 days after exposure, with most symptoms developing in roughly 4-5 days. Symptoms may range in severity from mild to critically severe. Roughly 80% of those infected will have mild symptoms. People of any age may become infected with COVID-19 and have the ability to transmit the virus. The most common  symptoms include: fever, fatigue, cough, body aches, headaches, sore throat, nasal congestion, shortness of breath, nausea, vomiting, diarrhea, changes in smell and/or taste.    COURSE OF ILLNESS Some patients may begin with mild disease which can progress quickly into critical symptoms. If your symptoms are worsening please call ahead to the Emergency Department and proceed there for further treatment. Recovery time appears to be roughly 1-2 weeks for mild symptoms and 3-6 weeks for severe disease.   GO IMMEDIATELY TO ER FOR FEVER YOU ARE UNABLE TO GET DOWN WITH TYLENOL, BREATHING PROBLEMS, CHEST PAIN, FATIGUE, LETHARGY, INABILITY TO EAT OR DRINK, ETC  QUARANTINE AND ISOLATION: To help decrease the spread of COVID-19 please remain isolated if you have COVID infection or are highly suspected to have COVID infection. This means -stay home and isolate to one room in the home if you live with others. Do not share a bed or bathroom with others while ill, sanitize and wipe down all countertops and keep common areas clean and disinfected. Stay home for 5 days. If you have no symptoms or your symptoms are resolving after 5 days, you can leave your house. Continue to wear a mask around others for 5 additional days. If you have been in close contact (within 6 feet) of someone diagnosed with COVID 19, you are advised to quarantine in your home for 14 days as symptoms can develop anywhere from 2-14 days after exposure to the virus. If you develop symptoms, you  must isolate.  Most current guidelines for COVID after exposure -unvaccinated: isolate 5 days and strict mask use x 5 days. Test on day 5 is possible -vaccinated: wear mask x 10 days if symptoms do not develop -You do not necessarily need to be tested for COVID if you have + exposure and  develop symptoms. Just isolate at home x10 days from symptom onset During this global pandemic, CDC advises to practice social distancing, try to stay at least 39ft away  from others at all times. Wear a face covering. Wash and sanitize your hands regularly and avoid going anywhere that is not necessary.  KEEP IN MIND THAT THE COVID TEST IS NOT 100% ACCURATE AND YOU SHOULD STILL DO EVERYTHING TO PREVENT POTENTIAL SPREAD OF VIRUS TO OTHERS (WEAR MASK, WEAR GLOVES, WASH HANDS AND SANITIZE REGULARLY). IF INITIAL TEST IS NEGATIVE, THIS MAY NOT MEAN YOU ARE DEFINITELY NEGATIVE. MOST ACCURATE TESTING IS DONE 5-7 DAYS AFTER EXPOSURE.   It is not advised by CDC to get re-tested after receiving a positive COVID test since you can still test positive for weeks to months after you have already cleared the virus.   *If you have not been vaccinated for COVID, I strongly suggest you consider getting vaccinated as long as there are no contraindications.      ED Prescriptions    Medication Sig Dispense Auth. Provider   lidocaine (XYLOCAINE) 2 % solution Use as directed 15 mLs in the mouth or throat every 3 (three) hours as  needed for up to 5 days for mouth pain (swish and spit). 100 mL Eusebio FriendlyEaves, Svea Pusch B, PA-C   promethazine-dextromethorphan (PROMETHAZINE-DM) 6.25-15 MG/5ML syrup Take 5 mLs by mouth 4 (four) times daily as needed for cough. 118 mL Shirlee LatchEaves, Marria Mathison B, PA-C     PDMP not reviewed this encounter.   Shirlee Latchaves, Sanii Kukla B, PA-C 11/03/20 1141

## 2020-11-03 NOTE — Discharge Instructions (Signed)
Negative strep test.  URI/COLD SYMPTOMS: Your exam today is consistent with a viral illness. Antibiotics are not indicated at this time. Use medications as directed, including cough syrup, nasal saline, and decongestants. Your symptoms should improve over the next few days and resolve within 7-10 days. Increase rest and fluids. F/u if symptoms worsen or predominate such as sore throat, ear pain, productive cough, shortness of breath, or if you develop high fevers or worsening fatigue over the next several days.    You have received COVID testing today either for positive exposure, concerning symptoms that could be related to COVID infection, screening purposes, or re-testing after confirmed positive.  Your test obtained today checks for active viral infection in the last 1-2 weeks. If your test is negative now, you can still test positive later. So, if you do develop symptoms you should either get re-tested and/or isolate x 5 days and then strict mask use x 5 days (unvaccinated) or mask use x 10 days (vaccinated). Please follow CDC guidelines.  While Rapid antigen tests come back in 15-20 minutes, send out PCR/molecular test results typically come back within 1-3 days. In the mean time, if you are symptomatic, assume this could be a positive test and treat/monitor yourself as if you do have COVID.   We will call with test results if positive. Please download the MyChart app and set up a profile to access test results.   If symptomatic, go home and rest. Push fluids. Take Tylenol as needed for discomfort. Gargle warm salt water. Throat lozenges. Take Mucinex DM or Robitussin for cough. Humidifier in bedroom to ease coughing. Warm showers. Also review the COVID handout for more information.  COVID-19 INFECTION: The incubation period of COVID-19 is approximately 14 days after exposure, with most symptoms developing in roughly 4-5 days. Symptoms may range in severity from mild to critically severe. Roughly  80% of those infected will have mild symptoms. People of any age may become infected with COVID-19 and have the ability to transmit the virus. The most common symptoms include: fever, fatigue, cough, body aches, headaches, sore throat, nasal congestion, shortness of breath, nausea, vomiting, diarrhea, changes in smell and/or taste.    COURSE OF ILLNESS Some patients may begin with mild disease which can progress quickly into critical symptoms. If your symptoms are worsening please call ahead to the Emergency Department and proceed there for further treatment. Recovery time appears to be roughly 1-2 weeks for mild symptoms and 3-6 weeks for severe disease.   GO IMMEDIATELY TO ER FOR FEVER YOU ARE UNABLE TO GET DOWN WITH TYLENOL, BREATHING PROBLEMS, CHEST PAIN, FATIGUE, LETHARGY, INABILITY TO EAT OR DRINK, ETC  QUARANTINE AND ISOLATION: To help decrease the spread of COVID-19 please remain isolated if you have COVID infection or are highly suspected to have COVID infection. This means -stay home and isolate to one room in the home if you live with others. Do not share a bed or bathroom with others while ill, sanitize and wipe down all countertops and keep common areas clean and disinfected. Stay home for 5 days. If you have no symptoms or your symptoms are resolving after 5 days, you can leave your house. Continue to wear a mask around others for 5 additional days. If you have been in close contact (within 6 feet) of someone diagnosed with COVID 19, you are advised to quarantine in your home for 14 days as symptoms can develop anywhere from 2-14 days after exposure to the virus. If you  develop symptoms, you  must isolate.  Most current guidelines for COVID after exposure -unvaccinated: isolate 5 days and strict mask use x 5 days. Test on day 5 is possible -vaccinated: wear mask x 10 days if symptoms do not develop -You do not necessarily need to be tested for COVID if you have + exposure and  develop  symptoms. Just isolate at home x10 days from symptom onset During this global pandemic, CDC advises to practice social distancing, try to stay at least 78ft away from others at all times. Wear a face covering. Wash and sanitize your hands regularly and avoid going anywhere that is not necessary.  KEEP IN MIND THAT THE COVID TEST IS NOT 100% ACCURATE AND YOU SHOULD STILL DO EVERYTHING TO PREVENT POTENTIAL SPREAD OF VIRUS TO OTHERS (WEAR MASK, WEAR GLOVES, WASH HANDS AND SANITIZE REGULARLY). IF INITIAL TEST IS NEGATIVE, THIS MAY NOT MEAN YOU ARE DEFINITELY NEGATIVE. MOST ACCURATE TESTING IS DONE 5-7 DAYS AFTER EXPOSURE.   It is not advised by CDC to get re-tested after receiving a positive COVID test since you can still test positive for weeks to months after you have already cleared the virus.   *If you have not been vaccinated for COVID, I strongly suggest you consider getting vaccinated as long as there are no contraindications.

## 2021-01-23 ENCOUNTER — Other Ambulatory Visit: Payer: Self-pay

## 2021-01-23 ENCOUNTER — Emergency Department
Admission: EM | Admit: 2021-01-23 | Discharge: 2021-01-23 | Disposition: A | Discharge status: Home / Self Care | Payer: 59 | Attending: Emergency Medicine | Admitting: Emergency Medicine

## 2021-01-23 ENCOUNTER — Encounter: Payer: Self-pay | Admitting: Emergency Medicine

## 2021-01-23 DIAGNOSIS — S01511A Laceration without foreign body of lip, initial encounter: Secondary | ICD-10-CM | POA: Insufficient documentation

## 2021-01-23 DIAGNOSIS — S0181XA Laceration without foreign body of other part of head, initial encounter: Secondary | ICD-10-CM

## 2021-01-23 DIAGNOSIS — Y92009 Unspecified place in unspecified non-institutional (private) residence as the place of occurrence of the external cause: Secondary | ICD-10-CM | POA: Insufficient documentation

## 2021-01-23 DIAGNOSIS — W010XXA Fall on same level from slipping, tripping and stumbling without subsequent striking against object, initial encounter: Secondary | ICD-10-CM | POA: Insufficient documentation

## 2021-01-23 DIAGNOSIS — S0993XA Unspecified injury of face, initial encounter: Secondary | ICD-10-CM | POA: Diagnosis present

## 2021-01-23 MED ORDER — BENZOCAINE 20 % MT AERO
INHALATION_SPRAY | Freq: Once | OROMUCOSAL | Status: AC
  Filled 2021-01-23: qty 57

## 2021-01-23 MED ORDER — LIDOCAINE-EPINEPHRINE 2 %-1:100000 IJ SOLN
20.0000 mL | Freq: Once | INTRAMUSCULAR | Status: AC
  Administered 2021-01-23: 20 mL
  Filled 2021-01-23: qty 1

## 2021-01-23 NOTE — ED Notes (Signed)
Lidocaine and benzocaine pulled for EDP.

## 2021-01-23 NOTE — ED Provider Notes (Signed)
[facility] Emergency Department Provider Note  ____________________________________________  Time seen: Approximately 9:35 PM  I have reviewed the triage vital signs and the nursing notes.   HISTORY  Chief Complaint Laceration    HPI Andrea Velez is a 40 y.o. female with no significant past medical history who comes ED complaining of pain over the upper lip after a fall.  She was in her usual state of health, when her daughter fell and got a chin bruise earlier today.  When the patient checked on her daughter and saw the injury, she got dizzy and passed out.  She woke up on the floor with bleeding from her upper lip.  Denies dental pain or malocclusion.  No vision changes or eye pain.  No headache or neck pain.    Past Medical History:  Diagnosis Date   Anxiety    Seasonal allergies    Vitamin D deficiency      Patient Active Problem List   Diagnosis Date Noted   Pain due to intrauterine contraceptive device (IUD) (HCC) 07/05/2018   Vitamin D deficiency 12/18/2014   Foot pain, right 12/18/2014   Anxiety 10/28/2014   Environmental allergies 10/28/2014     Past Surgical History:  Procedure Laterality Date   CHOLECYSTECTOMY  2009     Prior to Admission medications   Medication Sig Start Date End Date Taking? Authorizing Provider  levonorgestrel (MIRENA) 20 MCG/24HR IUD 1 each by Intrauterine route once.    [provider]  loratadine (CLARITIN) 10 MG tablet Take 10 mg by mouth daily.    [provider]  Multiple Vitamins-Minerals (MULTIVITAMIN WITH MINERALS) tablet Take 1 tablet by mouth daily.    [provider]  promethazine-dextromethorphan (PROMETHAZINE-DM) 6.25-15 MG/5ML syrup Take 5 mLs by mouth 4 (four) times daily as needed for cough. 11/03/20   Shirlee Latch, PA-C  Vitamin D, Ergocalciferol, (DRISDOL) 1.25 MG (50000 UT) CAPS capsule TAKE ONE CAPSULE BY MOUTH EVERY WEEK Patient not taking: No sig  reported 01/31/19   Purcell Nails, CNM     Allergies Patient has no known allergies.   Family History  Problem Relation Age of Onset   Diabetes Maternal Grandmother    Cancer Paternal Aunt        breast   Cancer Paternal Grandfather        colon   Hypertension Mother    Glaucoma Father    Breast cancer Neg Hx    Ovarian cancer Neg Hx    Colon cancer Neg Hx     Social History Social History   Tobacco Use   Smoking status: Never   Smokeless tobacco: Never  Vaping Use   Vaping Use: Never used  Substance Use Topics   Alcohol use: Yes    Alcohol/week: 1.0 - 2.0 standard drink    Types: 1 - 2 Glasses of wine per week    Comment: rare   Drug use: No    Review of Systems  Constitutional:   No fever or chills.  ENT:   No sore throat. No rhinorrhea. Cardiovascular:   No chest pain or syncope. Respiratory:   No dyspnea or cough. Gastrointestinal:   Negative for abdominal pain, vomiting and diarrhea.  Musculoskeletal:   Negative for focal pain or swelling All other systems reviewed and are negative except as documented above in ROS and HPI.  ____________________________________________   PHYSICAL EXAM:  VITAL SIGNS: ED Triage Vitals [01/23/21 1847]  Enc Vitals Group  BP (!) 110/58     Pulse Rate 80     Resp 20     Temp 98.4 F (36.9 C)     Temp Source Oral     SpO2 99 %     Weight 148 lb (67.1 kg)     Height 5\' 4"  (1.626 m)     Head Circumference      Peak Flow      Pain Score 6     Pain Loc      Pain Edu?      Excl. in GC?     Vital signs reviewed, nursing assessments reviewed.   Constitutional:   Alert and oriented. Non-toxic appearance. Eyes:   Conjunctivae are normal. EOMI. ENT      Head:   Normocephalic with diffuse facial contusion around the bilateral maxilla and nasal bridge.  No depressions or bony point tenderness.      Mouth/Throat:   There is a 3 cm irregular laceration along the upper lip extending from the left nasal frenulum to  the right and a crescent shape and crossing the vermilion border. MMM.  No epistaxis or septal hematoma.  No intraoral injury or laceration.  No dental injuries.      Neck:   No meningismus. Full ROM.  No midline spinal tenderness. Cardiovascular:   RRR. Cap refill less than 2 seconds. Respiratory:   Unlabored breathing.  Musculoskeletal:   Normal range of motion in all extremities.   Neurologic:   Normal speech and language.  Motor grossly intact. No acute focal neurologic deficits are appreciated.  ____________________________________________    LABS (pertinent positives/negatives) (all labs ordered are listed, but only abnormal results are displayed) Labs Reviewed - No data to display ____________________________________________   EKG  ____________________________________________    RADIOLOGY  No results found.  ____________________________________________   PROCEDURES . Laceration Repair  Date/Time: 01/23/2021 9:40 PM Performed by: 03/26/2021, MD Authorized by: Sharman Cheek, MD   Consent:    Consent obtained:  Verbal   Consent given by:  Patient   Risks discussed:  Infection, pain, retained foreign body, poor cosmetic result and poor wound healing   Alternatives discussed:  No treatment Universal protocol:    Patient identity confirmed:  Verbally with patient Anesthesia:    Anesthesia method:  Nerve block   Block location:  R maxillary nerve   Block needle gauge:  24 G   Block anesthetic:  Lidocaine 2% WITH epi   Block injection procedure:  Anatomic landmarks identified, anatomic landmarks palpated, negative aspiration for blood, introduced needle and incremental injection   Block outcome:  Anesthesia achieved Laceration details:    Location:  Lip   Lip location:  Upper exterior lip   Length (cm):  3 Pre-procedure details:    Preparation:  Patient was prepped and draped in usual sterile fashion Exploration:    Hemostasis achieved with:   Direct pressure   Wound exploration: entire depth of wound visualized     Wound extent: no fascia violation noted, no foreign bodies/material noted, no muscle damage noted, no nerve damage noted, no tendon damage noted, no underlying fracture noted and no vascular damage noted     Contaminated: no   Treatment:    Area cleansed with:  Saline and povidone-iodine   Amount of cleaning:  Extensive   Irrigation solution:  Sterile saline   Irrigation method:  Pressure wash   Visualized foreign bodies/material removed: no     Debridement:  None   Undermining:  Minimal   Layers/structures repaired:  Deep subcutaneous Deep subcutaneous:    Suture size:  5-0   Suture material:  Monocryl   Suture technique:  Simple interrupted   Number of sutures:  3 Skin repair:    Repair method:  Sutures   Suture size:  6-0   Wound skin closure material used: vicryl.   Suture technique:  Simple interrupted   Number of sutures:  6 Approximation:    Approximation:  Close   Vermilion border well-aligned: yes   Repair type:    Repair type:  Complex Post-procedure details:    Dressing:  Adhesive bandage   Procedure completion:  Tolerated well, no immediate complications  ____________________________________________  CLINICAL IMPRESSION / ASSESSMENT AND PLAN / ED COURSE  Pertinent labs & imaging results that were available during my care of the patient were reviewed by me and considered in my medical decision making (see chart for details).  Andrea Velez was evaluated in Emergency Department on 01/23/2021 for the symptoms described in the history of present illness. She was evaluated in the context of the global COVID-19 pandemic, which necessitated consideration that the patient might be at risk for infection with the SARS-CoV-2 virus that causes COVID-19. Institutional protocols and algorithms that pertain to the evaluation of patients at risk for COVID-19 are in a state of rapid change based on  information released by regulatory bodies including the CDC and federal and state organizations. These policies and algorithms were followed during the patient's care in the ED.   Patient presents after facial trauma from vasovagal syncope.  Had a facial laceration which was repaired with good cosmetic result.  Mental status is normal, no high risk features.  Very low suspicion for intracranial hemorrhage or spinal injury, imaging not warranted.  Stable for discharge.      ____________________________________________   FINAL CLINICAL IMPRESSION(S) / ED DIAGNOSES    Final diagnoses:  Facial laceration, initial encounter     ED Discharge Orders     None       Portions of this note were generated with dragon dictation software. Dictation errors may occur despite best attempts at proofreading.   Sharman Cheek, MD 01/23/21 2142

## 2021-01-23 NOTE — ED Notes (Signed)
See triage note, pt had vasovagal syncope after seeing her daughter's blood. Pt states she fell in bathroom but is unsure what she cut lip on. Deep laceration to upper lip noted. Pt given sterile guaze to continue controlling bleeding with pressure. Bruise noted on bridge of nose and right forehead. Pt is AOX4, NAD noted. Laceration cart placed by room.

## 2021-01-23 NOTE — ED Triage Notes (Signed)
Pt states that she was at home and her daughter fell and tripped over the dog, she called for the pt, she went to see her daughter. Pt seen that the daughter had a bruise and cut on her chin. After seeing that pt passed out. She has a large laceration on her lip.

## 2021-01-23 NOTE — ED Notes (Signed)
Pt A&OX4, ambulatory at d/c with independent steady gait. Pt stated she feels better and verbalized understanding of d/c instructions, wound care and follow up instructions. Pt declined wheelchair at d/c.

## 2021-02-01 ENCOUNTER — Ambulatory Visit
Admission: EM | Admit: 2021-02-01 | Discharge: 2021-02-01 | Disposition: A | Discharge status: Home / Self Care | Payer: 59 | Attending: Family Medicine | Admitting: Family Medicine

## 2021-02-01 ENCOUNTER — Other Ambulatory Visit: Payer: Self-pay

## 2021-02-01 DIAGNOSIS — Z4802 Encounter for removal of sutures: Secondary | ICD-10-CM | POA: Diagnosis not present

## 2021-02-01 NOTE — ED Triage Notes (Signed)
Pt here for suture removal from lip, 5 sutures removed, skin well approximated, pt is experiencing lip numbness , Dr Adriana Simas made aware

## 2021-02-01 NOTE — Discharge Instructions (Addendum)
The sutures have been removed.  You can apply scar cream now.  Try to continue to ice the lip to help with swelling and perform some facial exercises.  You could have some permanent nerve damage that has been done.  If you are still concerned after another week or 2 if not seeing any improvement then please follow-up with your primary care provider where they may consider referring you elsewhere.

## 2021-02-01 NOTE — ED Provider Notes (Signed)
[facility]    CSN: [id number] Arrival date & time: 02/01/21  0800      History   Chief Complaint No chief complaint on file.   HPI Andrea Velez is a 40 y.o. female presenting for suture removal.  Patient fell on 01/23/2021.  She states that she saw a bruise on her daughter's chin and became dizzy and had a vasovagal syncopal episode.  She says she came to and noticed bleeding from her upper lip.  She had sutures placed at [facility] ED.  Sick sutures were placed.  Patient states that she was told they are dissolvable but if they do not come out within 10 days that she can have them removed.  Patient does state that she still has a little bit of numbness in her upper lip and does not think that it moves "right."  She has no other complaints.  HPI  Past Medical History:  Diagnosis Date   Anxiety    Seasonal allergies    Vitamin D deficiency     Patient Active Problem List   Diagnosis Date Noted   Pain due to intrauterine contraceptive device (IUD) (HCC) 07/05/2018   Vitamin D deficiency 12/18/2014   Foot pain, right 12/18/2014   Anxiety 10/28/2014   Environmental allergies 10/28/2014    Past Surgical History:  Procedure Laterality Date   CHOLECYSTECTOMY  2009    OB History     Gravida  2   Para  2   Term  2   Preterm      AB      Living  2      SAB      IAB      Ectopic      Multiple      Live Births  2            Home Medications    Prior to Admission medications   Medication Sig Start Date End Date Taking? Authorizing Provider  levonorgestrel (MIRENA) 20 MCG/24HR IUD 1 each by Intrauterine route once.    [provider]  loratadine (CLARITIN) 10 MG tablet Take 10 mg by mouth daily.    [provider]  Multiple Vitamins-Minerals (MULTIVITAMIN WITH MINERALS) tablet Take 1 tablet by mouth daily.    [provider]  promethazine-dextromethorphan (PROMETHAZINE-DM) 6.25-15 MG/5ML syrup Take 5 mLs by mouth 4  (four) times daily as needed for cough. 11/03/20   Shirlee Latch, PA-C  Vitamin D, Ergocalciferol, (DRISDOL) 1.25 MG (50000 UT) CAPS capsule TAKE ONE CAPSULE BY MOUTH EVERY WEEK Patient not taking: No sig reported 01/31/19   Purcell Nails, CNM    Family History Family History  Problem Relation Age of Onset   Diabetes Maternal Grandmother    Cancer Paternal Aunt        breast   Cancer Paternal Grandfather        colon   Hypertension Mother    Glaucoma Father    Breast cancer Neg Hx    Ovarian cancer Neg Hx    Colon cancer Neg Hx     Social History Social History   Tobacco Use   Smoking status: Never   Smokeless tobacco: Never  Vaping Use   Vaping Use: Never used  Substance Use Topics   Alcohol use: Yes    Alcohol/week: 1.0 - 2.0 standard drink    Types: 1 - 2 Glasses of wine per week    Comment: rare   Drug use: No  Allergies   Patient has no known allergies.   Review of Systems Review of Systems  Skin:  Positive for wound.  Neurological:  Positive for numbness. Negative for dizziness, weakness and headaches.    Physical Exam Triage Vital Signs ED Triage Vitals  Enc Vitals Group     BP      Pulse      Resp      Temp      Temp src      SpO2      Weight      Height      Head Circumference      Peak Flow      Pain Score      Pain Loc      Pain Edu?      Excl. in GC?    No data found.  Updated Vital Signs BP 112/68   Pulse 72   Temp 98.3 F (36.8 C)   Resp 18   SpO2 97%       Physical Exam Vitals and nursing note reviewed.  Constitutional:      General: She is not in acute distress.    Appearance: Normal appearance. She is not ill-appearing or toxic-appearing.  HENT:     Head: Normocephalic and atraumatic.  Eyes:     General: No scleral icterus.       Right eye: No discharge.        Left eye: No discharge.     Conjunctiva/sclera: Conjunctivae normal.  Cardiovascular:     Rate and Rhythm: Normal rate and regular rhythm.   Pulmonary:     Effort: Pulmonary effort is normal. No respiratory distress.  Musculoskeletal:     Cervical back: Neck supple.  Skin:    General: Skin is dry.     Comments: There is a 3 cm irregular laceration along the upper lip extending from the left nasal frenulum to the right and a crescent shape and crossing the vermilion border. Area appears to be well healing. Mild TTP. Slight variation in her smile. Right side of her lip does not elevate quite as much as the left.  Neurological:     General: No focal deficit present.     Mental Status: She is alert. Mental status is at baseline.     Motor: No weakness.     Gait: Gait normal.  Psychiatric:        Mood and Affect: Mood normal.        Behavior: Behavior normal.        Thought Content: Thought content normal.     UC Treatments / Results  Labs (all labs ordered are listed, but only abnormal results are displayed) Labs Reviewed - No data to display  EKG   Radiology No results found.  Procedures Procedures (including critical care time)  SUTURE REMOVAL: 5 Vicryl sutures removed.  Patient states the 6 suture came out on its own.  The area is well-healing and mildly tender.  Medications Ordered in UC Medications - No data to display  Initial Impression / Assessment and Plan / UC Course  I have reviewed the triage vital signs and the nursing notes.  Pertinent labs & imaging results that were available during my care of the patient were reviewed by me and considered in my medical decision making (see chart for details).  40 year old female presenting for suture removal.  5 Vicryl sutures removed.  There were 6 sutures placed but the other suture came out on its  own.  Patient concerned because the right side of her smile is lower than the left.  I do agree that it is slightly different.  I have advised patient to ice the area and try to perform some facial muscle exercises.  If she is still concerned about that she can  follow-up with her primary care provider where they may refer her from there.    Final Clinical Impressions(s) / UC Diagnoses   Final diagnoses:  Visit for suture removal     Discharge Instructions      The sutures have been removed.  You can apply scar cream now.  Try to continue to ice the lip to help with swelling and perform some facial exercises.  You could have some permanent nerve damage that has been done.  If you are still concerned after another week or 2 if not seeing any improvement then please follow-up with your primary care provider where they may consider referring you elsewhere.     ED Prescriptions   None    PDMP not reviewed this encounter.   Shirlee Latch, PA-C 02/01/21 [phone]

## 2021-02-18 ENCOUNTER — Encounter: Payer: 59 | Admitting: Certified Nurse Midwife

## 2021-02-18 ENCOUNTER — Encounter: Payer: 59 | Admitting: Obstetrics and Gynecology

## 2021-02-21 ENCOUNTER — Ambulatory Visit (INDEPENDENT_AMBULATORY_CARE_PROVIDER_SITE_OTHER): Payer: 59 | Admitting: Certified Nurse Midwife

## 2021-02-21 ENCOUNTER — Encounter: Payer: Self-pay | Admitting: Certified Nurse Midwife

## 2021-02-21 ENCOUNTER — Other Ambulatory Visit: Payer: Self-pay

## 2021-02-21 VITALS — BP 123/81 | HR 75 | Ht 64.0 in | Wt 148.8 lb

## 2021-02-21 DIAGNOSIS — Z01419 Encounter for gynecological examination (general) (routine) without abnormal findings: Secondary | ICD-10-CM

## 2021-02-21 DIAGNOSIS — Z1231 Encounter for screening mammogram for malignant neoplasm of breast: Secondary | ICD-10-CM | POA: Diagnosis not present

## 2021-02-21 DIAGNOSIS — Z23 Encounter for immunization: Secondary | ICD-10-CM

## 2021-02-21 MED ORDER — TETANUS-DIPHTH-ACELL PERTUSSIS 5-2.5-18.5 LF-MCG/0.5 IM SUSY
0.5000 mL | PREFILLED_SYRINGE | Freq: Once | INTRAMUSCULAR | Status: AC
  Administered 2021-02-21: 0.5 mL via INTRAMUSCULAR

## 2021-02-21 NOTE — Progress Notes (Signed)
GYNECOLOGY ANNUAL PREVENTATIVE CARE ENCOUNTER NOTE  History:     Andrea Velez is a 40 y.o. G21P2002 female here for a routine annual gynecologic exam.  Current complaints: none.   Denies abnormal vaginal bleeding, discharge, pelvic pain, problems with intercourse or other gynecologic concerns.     Social Relationship: married  Living: spouse ( 2 daughters , 28 & 80) Work: Engineer, manufacturing at [employer], coding and billing, Loss adjuster, chartered) Exercise: daily ([place] bike & [place]) Smoke/Alcohol/drug use: occ alcohol, denies drug use and smoking.   Gynecologic History No LMP recorded. (Menstrual status: IUD). Contraception: IUD Last Pap: 12/2016. Results were: normal with negative HPV Last mammogram: 10/2020. Results were: normal   The pregnancy intention screening data noted above was reviewed. Potential methods of contraception were discussed. The patient elected to proceed with IUD . Obstetric History OB History  Gravida Para Term Preterm AB Living  2 2 2     2   SAB IAB Ectopic Multiple Live Births          2    # Outcome Date GA Lbr Len/2nd Weight Sex Delivery Anes PTL Lv  2 Term 2005   7 lb 14 oz (3.572 kg) F Vag-Spont   LIV  1 Term 2002   7 lb 14 oz (3.572 kg) F Vag-Spont   LIV    Past Medical History:  Diagnosis Date   Anxiety    Seasonal allergies    Vitamin D deficiency     Past Surgical History:  Procedure Laterality Date   CHOLECYSTECTOMY  2009    Current Outpatient Medications on File Prior to Visit  Medication Sig Dispense Refill   levonorgestrel (MIRENA) 20 MCG/24HR IUD 1 each by Intrauterine route once.     loratadine (CLARITIN) 10 MG tablet Take 10 mg by mouth daily.     Multiple Vitamins-Minerals (MULTIVITAMIN WITH MINERALS) tablet Take 1 tablet by mouth daily.     promethazine-dextromethorphan (PROMETHAZINE-DM) 6.25-15 MG/5ML syrup Take 5 mLs by mouth 4 (four) times daily as needed for cough. 118 mL 0   Vitamin D, Ergocalciferol, (DRISDOL) 1.25 MG  (50000 UT) CAPS capsule TAKE ONE CAPSULE BY MOUTH EVERY WEEK (Patient not taking: No sig reported) 30 capsule 4   No current facility-administered medications on file prior to visit.    No Known Allergies  Social History:  reports that she has never smoked. She has never used smokeless tobacco. She reports current alcohol use of about 1.0 - 2.0 standard drink of alcohol per week. She reports that she does not use drugs.  Family History  Problem Relation Age of Onset   Diabetes Maternal Grandmother    Cancer Paternal Aunt        breast   Cancer Paternal Grandfather        colon   Hypertension Mother    Glaucoma Father    Breast cancer Neg Hx    Ovarian cancer Neg Hx    Colon cancer Neg Hx     The following portions of the patient's history were reviewed and updated as appropriate: allergies, current medications, past family history, past medical history, past social history, past surgical history and problem list.  Review of Systems Pertinent items noted in HPI and remainder of comprehensive ROS otherwise negative.  Physical Exam:  BP 123/81   Pulse 75   Ht 5\' 4"  (1.626 m)   Wt 148 lb 12.8 oz (67.5 kg)   BMI 25.54 kg/m  CONSTITUTIONAL: Well-developed, well-nourished female in no  acute distress.  HENT:  Normocephalic, atraumatic, External right and left ear normal. Oropharynx is clear and moist EYES: Conjunctivae and EOM are normal. Pupils are equal, round, and reactive to light. No scleral icterus.  NECK: Normal range of motion, supple, no masses.  Normal thyroid.  SKIN: Skin is warm and dry. No rash noted. Not diaphoretic. No erythema. No pallor. MUSCULOSKELETAL: Normal range of motion. No tenderness.  No cyanosis, clubbing, or edema.  2+ distal pulses. NEUROLOGIC: Alert and oriented to person, place, and time. Normal reflexes, muscle tone coordination.  PSYCHIATRIC: Normal mood and affect. Normal behavior. Normal judgment and thought content. CARDIOVASCULAR: Normal heart  rate noted, regular rhythm RESPIRATORY: Clear to auscultation bilaterally. Effort and breath sounds normal, no problems with respiration noted. BREASTS: Symmetric in size. No masses, tenderness, skin changes, nipple drainage, or lymphadenopathy bilaterally.  ABDOMEN: Soft, no distention noted.  No tenderness, rebound or guarding.  PELVIC: Normal appearing external genitalia and urethral meatus; normal appearing vaginal mucosa and cervix.  No abnormal discharge noted.  Pap smear not due.  Normal uterine size, no other palpable masses, no uterine or adnexal tenderness.    Assessment and Plan:    1. Well woman exam with routine gynecological exam  Pap: not due Mammogram : ordered Labs: none Refills: none Referral: none Routine preventative health maintenance measures emphasized. Please refer to After Visit Summary for other counseling recommendations.      Doreene Burke, CNM [facility],  [facility]

## 2021-09-23 ENCOUNTER — Ambulatory Visit (INDEPENDENT_AMBULATORY_CARE_PROVIDER_SITE_OTHER): Payer: 59 | Admitting: Certified Nurse Midwife

## 2021-09-23 ENCOUNTER — Other Ambulatory Visit: Payer: Self-pay

## 2021-09-23 VITALS — Temp 98.1°F

## 2021-09-23 DIAGNOSIS — R300 Dysuria: Secondary | ICD-10-CM

## 2021-09-23 LAB — POCT URINALYSIS DIPSTICK
Bilirubin, UA: NEGATIVE
Blood, UA: NEGATIVE
Glucose, UA: NEGATIVE
Ketones, UA: NEGATIVE
Leukocytes, UA: NEGATIVE
Nitrite, UA: NEGATIVE
Protein, UA: NEGATIVE
Spec Grav, UA: 1.01 (ref 1.010–1.025)
Urobilinogen, UA: 0.2 E.U./dL
pH, UA: 6 (ref 5.0–8.0)

## 2021-09-23 NOTE — Progress Notes (Signed)
Patient came in with complaints of painful urination. She states that it feels like someone kicked her in the crotch. Pain is sharp and painful to sit and walk. She has been having some slight pelvic pain along with frequency and urgency x 2 weeks. She has increased her water intake and has been taking Azo. Her last dose of Azo was early yesterday. She denies having fever. She wanted something topical to help with pain. I consulted with Dr. Logan Bores and he suggested that she be put on his schedule, to check to see if any lesions were there. Patient did not want to be seen by a female physician and was quite upset at the mention that she may have any lesions. I suggested that she follow uti protocol: ? ?Increase water intake and add cranberry juice. ?Take extra strength Tylenol per label instructions or Ibuprofen 800 mg every 8 hours if you are not pregnant. ?AZO otc per label instructions.  ? ?I also suggested that we rule continue to rule out uti and if culture came back negative, then she would need to schedule an appointment for further evaluation. ? ?

## 2021-09-24 ENCOUNTER — Encounter: Payer: Self-pay | Admitting: Certified Nurse Midwife

## 2021-09-25 LAB — URINE CULTURE: Organism ID, Bacteria: NO GROWTH

## 2021-09-26 ENCOUNTER — Encounter: Payer: Self-pay | Admitting: Certified Nurse Midwife

## 2021-09-26 NOTE — Telephone Encounter (Signed)
Pt has appt scheduled with MS 09/28/21 for concerns.  ?

## 2021-09-28 ENCOUNTER — Other Ambulatory Visit: Payer: Self-pay

## 2021-09-28 ENCOUNTER — Ambulatory Visit (INDEPENDENT_AMBULATORY_CARE_PROVIDER_SITE_OTHER): Payer: 59 | Admitting: Obstetrics

## 2021-09-28 ENCOUNTER — Encounter: Payer: Self-pay | Admitting: Obstetrics

## 2021-09-28 ENCOUNTER — Other Ambulatory Visit (HOSPITAL_COMMUNITY)
Admission: RE | Admit: 2021-09-28 | Discharge: 2021-09-28 | Disposition: A | Discharge status: Home / Self Care | Payer: 59 | Source: Ambulatory Visit | Attending: Obstetrics | Admitting: Obstetrics

## 2021-09-28 VITALS — BP 122/76 | HR 106 | Ht 64.0 in | Wt 144.0 lb

## 2021-09-28 DIAGNOSIS — F419 Anxiety disorder, unspecified: Secondary | ICD-10-CM | POA: Diagnosis not present

## 2021-09-28 DIAGNOSIS — Z113 Encounter for screening for infections with a predominantly sexual mode of transmission: Secondary | ICD-10-CM | POA: Diagnosis not present

## 2021-09-28 MED ORDER — VALACYCLOVIR HCL 1 G PO TABS: 1000.0000 mg | ORAL_TABLET | Freq: Two times a day (BID) | ORAL | 6 refills | Status: DC

## 2021-09-28 NOTE — Progress Notes (Signed)
GYN ENCOUNTER  ? ?SUBJECTIVE ?HPI ?Andrea Velez is a 41 y.o. [id number] who presents to day for evaluation of a possible herpes infection. She reports that she started feeling what seemed like the onset of UTI last Tuesday. She came in for a urine culture on Friday, 09/23/21, which came back negative. She noticed a painful bump on her labia accompanied by painful swelling on Monday. The pain has since gotten worse with continued dysuria, and she has noticed more lesions on her labia. She states that she has never had a herpes outbreak before and is monogamous with her partner of 20 years. She is currently experiencing multiple stressors in her home life as well as untreated anxiety. ? ?ROS: Pertinent items noted in HPI. ? ?Past Medical History:  ?Diagnosis Date  ? Anxiety   ? Seasonal allergies   ? Vitamin D deficiency   ? ?Past Surgical History:  ?Procedure Laterality Date  ? CHOLECYSTECTOMY  2009  ? ?OB History   ? ? Gravida  ?2  ? Para  ?2  ? Term  ?2  ? Preterm  ?   ? AB  ?   ? Living  ?2  ?  ? ? SAB  ?   ? IAB  ?   ? Ectopic  ?   ? Multiple  ?   ? Live Births  ?2  ?   ?  ?  ? ?OBJECTIVE ?BP 122/76   Pulse (!) 106   Ht 5\' 4"  (1.626 m)   Wt 144 lb (65.3 kg)   BMI 24.72 kg/m?  ? ?Pelvic: multiple clustered lesions consistent with HSV noted on labia, introitus, and perianal area. Lesions on labia are mostly ulcerated; vaginal and perianal lesions appear vesicular.  ? ?ASSESSMENT ? ?1) HSV, primary outbreak ? ?2) Anxiety, increased stress ? ?PLAN ? ?1) Swab and HSV serology collected. Encouraged screening for other STIs; Voncille declines at this time. Encouraged frank conversation with partner.  She would like testing for yeast and BV. Discussed transmission of HSV, typical course of outbreaks, and treatment for primary infection and subsequent outbreaks. Rx sent to pharmacy on file. Reviewed comfort measures for painful lesions.  ? ?2) Haylee would like referral to counselor. Declines medications at this  time. ? ?Follow up for annual visit or PRN. ? ?Dorene Grebe, CNM ?

## 2021-09-29 ENCOUNTER — Other Ambulatory Visit: Payer: Self-pay | Admitting: Obstetrics

## 2021-09-29 LAB — CERVICOVAGINAL ANCILLARY ONLY
Bacterial Vaginitis (gardnerella): NEGATIVE
Candida Glabrata: NEGATIVE
Candida Vaginitis: POSITIVE — AB
Comment: NEGATIVE
Comment: NEGATIVE
Comment: NEGATIVE

## 2021-09-29 LAB — HSV(HERPES SIMPLEX VRS) I + II AB-IGG
HSV 1 Glycoprotein G Ab, IgG: <0.91 index (ref 0.00–0.90)
HSV 2 IgG, Type Spec: <0.91 index (ref 0.00–0.90)

## 2021-09-30 ENCOUNTER — Encounter: Payer: Self-pay | Admitting: Obstetrics

## 2021-09-30 ENCOUNTER — Other Ambulatory Visit: Payer: Self-pay | Admitting: Obstetrics

## 2021-09-30 MED ORDER — FLUCONAZOLE 150 MG PO TABS: 150.0000 mg | ORAL_TABLET | Freq: Once | ORAL | 0 refills | Status: AC

## 2021-10-07 ENCOUNTER — Other Ambulatory Visit: Payer: Self-pay | Admitting: Obstetrics

## 2021-10-07 MED ORDER — FLUCONAZOLE 150 MG PO TABS: 150.0000 mg | ORAL_TABLET | Freq: Once | ORAL | 1 refills | Status: AC

## 2021-11-04 ENCOUNTER — Encounter: Payer: Self-pay | Admitting: Certified Nurse Midwife

## 2021-11-14 ENCOUNTER — Ambulatory Visit (INDEPENDENT_AMBULATORY_CARE_PROVIDER_SITE_OTHER): Payer: 59 | Admitting: Certified Nurse Midwife

## 2021-11-14 ENCOUNTER — Other Ambulatory Visit (HOSPITAL_COMMUNITY)
Admission: RE | Admit: 2021-11-14 | Discharge: 2021-11-14 | Disposition: A | Discharge status: Home / Self Care | Payer: 59 | Source: Ambulatory Visit | Attending: Certified Nurse Midwife | Admitting: Certified Nurse Midwife

## 2021-11-14 ENCOUNTER — Encounter: Payer: Self-pay | Admitting: Certified Nurse Midwife

## 2021-11-14 VITALS — BP 135/78 | HR 101 | Ht 64.0 in | Wt 147.1 lb

## 2021-11-14 DIAGNOSIS — N898 Other specified noninflammatory disorders of vagina: Secondary | ICD-10-CM | POA: Insufficient documentation

## 2021-11-14 NOTE — Addendum Note (Signed)
Addended by: Lady Deutscher on: 11/14/2021 04:20 PM ? ? Modules accepted: Orders ? ?

## 2021-11-14 NOTE — Progress Notes (Signed)
GYN ENCOUNTER NOTE ? ?Subjective:  ?    ? Andrea Velez is a 41 y.o. G81P2002 female is here for gynecologic evaluation of the following issues:  ?1. Vaginal discharge.  Pt has history of recurrent yeast infection .  ?  ?Gynecologic History ?No LMP recorded (lmp unknown). (Menstrual status: IUD). ?Contraception: IUD ?Last Pap: 01/31/2019. Results were: normal ?Last mammogram: 10/27/2020. Results were: normal ? ?Obstetric History ?OB History  ?Gravida Para Term Preterm AB Living  ?2 2 2     2   ?SAB IAB Ectopic Multiple Live Births  ?        2  ?  ?# Outcome Date GA Lbr Len/2nd Weight Sex Delivery Anes PTL Lv  ?2 Term 2005   7 lb 14 oz (3.572 kg) F Vag-Spont   LIV  ?1 Term 2002   7 lb 14 oz (3.572 kg) F Vag-Spont   LIV  ? ? ?Past Medical History:  ?Diagnosis Date  ? Anxiety   ? Seasonal allergies   ? Vitamin D deficiency   ? ? ?Past Surgical History:  ?Procedure Laterality Date  ? CHOLECYSTECTOMY  2009  ? ? ?Current Outpatient Medications on File Prior to Visit  ?Medication Sig Dispense Refill  ? levonorgestrel (MIRENA) 20 MCG/24HR IUD 1 each by Intrauterine route once.    ? Multiple Vitamins-Minerals (MULTIVITAMIN WITH MINERALS) tablet Take 1 tablet by mouth daily.    ? valACYclovir (VALTREX) 1000 MG tablet Take 1 tablet (1,000 mg total) by mouth 2 (two) times daily. 20 tablet 6  ? ?No current facility-administered medications on file prior to visit.  ? ? ?No Known Allergies ? ?Social History  ? ?Socioeconomic History  ? Marital status: Married  ?  Spouse name: Not on file  ? Number of children: Not on file  ? Years of education: Not on file  ? Highest education level: Not on file  ?Occupational History  ? Not on file  ?Tobacco Use  ? Smoking status: Never  ? Smokeless tobacco: Never  ?Vaping Use  ? Vaping Use: Never used  ?Substance and Sexual Activity  ? Alcohol use: Yes  ?  Alcohol/week: 1.0 - 2.0 standard drink  ?  Types: 1 - 2 Glasses of wine per week  ?  Comment: rare  ? Drug use: No  ? Sexual activity: Yes  ?   Birth control/protection: I.U.D.  ?  Comment: Mirena  ?Other Topics Concern  ? Not on file  ?Social History Narrative  ? Not on file  ? ?Social Determinants of Health  ? ?Financial Resource Strain: Not on file  ?Food Insecurity: Not on file  ?Transportation Needs: Not on file  ?Physical Activity: Not on file  ?Stress: Not on file  ?Social Connections: Not on file  ?Intimate Partner Violence: Not on file  ? ? ?Family History  ?Problem Relation Age of Onset  ? Diabetes Maternal Grandmother   ? Cancer Paternal Aunt   ?     breast  ? Cancer Paternal Grandfather   ?     colon  ? Hypertension Mother   ? Glaucoma Father   ? Breast cancer Neg Hx   ? Ovarian cancer Neg Hx   ? Colon cancer Neg Hx   ? ? ?The following portions of the patient's history were reviewed and updated as appropriate: allergies, current medications, past family history, past medical history, past social history, past surgical history and problem list. ? ?Review of Systems ?Review of Systems - Negative except as  mentioned in HPI ?Review of Systems - General ROS: negative for - chills, fatigue, fever, hot flashes, malaise or night sweats ?Hematological and Lymphatic ROS: negative for - bleeding problems or swollen lymph nodes ?Gastrointestinal ROS: negative for - abdominal pain, blood in stools, change in bowel habits and nausea/vomiting ?Musculoskeletal ROS: negative for - joint pain, muscle pain or muscular weakness ?Genito-Urinary ROS: negative for - change in menstrual cycle, dysmenorrhea, dyspareunia, dysuria, genital discharge, genital ulcers, hematuria, incontinence, irregular/heavy menses, nocturia or pelvic pain. Positive for increased vaginal discharge ? ?Objective:  ? ?BP 135/78   Pulse (!) 101   Ht 5\' 4"  (1.626 m)   Wt 147 lb 1.6 oz (66.7 kg)   LMP  (LMP Unknown)   BMI 25.25 kg/m?  ?CONSTITUTIONAL: Well-developed, well-nourished female in no acute distress.  ?HENT:  Normocephalic, atraumatic.  ?NECK: Normal range of motion, supple, no  masses.  Normal thyroid.  ?SKIN: Skin is warm and dry. No rash noted. Not diaphoretic. No erythema. No pallor. ?NEUROLGIC: Alert and oriented to person, place, and time. PSYCHIATRIC: Normal mood and affect. Normal behavior. Normal judgment and thought content. ?CARDIOVASCULAR:Not Examined ?RESPIRATORY: Not Examined ?BREASTS: Not Examined ?ABDOMEN: Soft, non distended; Non tender.  No Organomegaly. ?PELVIC: ? External Genitalia: Normal ? BUS: Normal ? Vagina: Normal ? Cervix: Normal, clear watery discharge, mild redness  ?MUSCULOSKELETAL: Normal range of motion. No tenderness.  No cyanosis, clubbing, or edema. ? ?Assessment:  ? ?Vaginal itching  ? ?Plan:  ?Reviewed possible causes of recurrent infection , including intercourse, mensuration, antibiotic use , IUD strings. Suggested use of boric acid vaginal suppository to help maintain normal pH balance. Sample given.  Swab collected will follow up with results. Also dis cussed use of vaginal moisturize prn for dryness. Follow up prn for annual exam.  ? ? , [employer]  ?  ?

## 2021-11-16 ENCOUNTER — Encounter: Payer: Self-pay | Admitting: Certified Nurse Midwife

## 2021-11-16 LAB — CERVICOVAGINAL ANCILLARY ONLY
Bacterial Vaginitis (gardnerella): NEGATIVE
Candida Glabrata: NEGATIVE
Candida Vaginitis: NEGATIVE
Chlamydia: NEGATIVE
Comment: NEGATIVE
Comment: NEGATIVE
Comment: NEGATIVE
Comment: NEGATIVE
Comment: NEGATIVE
Comment: NORMAL
Neisseria Gonorrhea: NEGATIVE
Trichomonas: NEGATIVE

## 2021-11-16 NOTE — Addendum Note (Signed)
Addended by: Mechele Claude on: 11/16/2021 06:34 PM ? ? Modules accepted: Orders ? ?

## 2021-12-01 ENCOUNTER — Ambulatory Visit
Admission: RE | Admit: 2021-12-01 | Discharge: 2021-12-01 | Disposition: A | Discharge status: Home / Self Care | Payer: 59 | Source: Ambulatory Visit | Attending: Certified Nurse Midwife | Admitting: Certified Nurse Midwife

## 2021-12-01 ENCOUNTER — Other Ambulatory Visit: Payer: Self-pay | Admitting: Certified Nurse Midwife

## 2021-12-01 ENCOUNTER — Encounter: Payer: Self-pay | Admitting: Certified Nurse Midwife

## 2021-12-01 DIAGNOSIS — Z01419 Encounter for gynecological examination (general) (routine) without abnormal findings: Secondary | ICD-10-CM

## 2021-12-01 DIAGNOSIS — Z1231 Encounter for screening mammogram for malignant neoplasm of breast: Secondary | ICD-10-CM | POA: Diagnosis present

## 2021-12-01 DIAGNOSIS — N6489 Other specified disorders of breast: Secondary | ICD-10-CM

## 2021-12-02 ENCOUNTER — Other Ambulatory Visit: Payer: Self-pay | Admitting: Certified Nurse Midwife

## 2021-12-02 DIAGNOSIS — R928 Other abnormal and inconclusive findings on diagnostic imaging of breast: Secondary | ICD-10-CM

## 2021-12-02 DIAGNOSIS — N6489 Other specified disorders of breast: Secondary | ICD-10-CM

## 2021-12-23 ENCOUNTER — Ambulatory Visit
Admission: RE | Admit: 2021-12-23 | Discharge: 2021-12-23 | Disposition: A | Discharge status: Home / Self Care | Payer: 59 | Source: Ambulatory Visit | Attending: Certified Nurse Midwife | Admitting: Certified Nurse Midwife

## 2021-12-23 DIAGNOSIS — R928 Other abnormal and inconclusive findings on diagnostic imaging of breast: Secondary | ICD-10-CM | POA: Diagnosis present

## 2021-12-23 DIAGNOSIS — N6489 Other specified disorders of breast: Secondary | ICD-10-CM

## 2021-12-24 ENCOUNTER — Encounter: Payer: Self-pay | Admitting: Certified Nurse Midwife

## 2021-12-27 ENCOUNTER — Other Ambulatory Visit: Payer: Self-pay | Admitting: Certified Nurse Midwife

## 2021-12-27 DIAGNOSIS — N6489 Other specified disorders of breast: Secondary | ICD-10-CM

## 2021-12-27 DIAGNOSIS — R928 Other abnormal and inconclusive findings on diagnostic imaging of breast: Secondary | ICD-10-CM

## 2021-12-28 ENCOUNTER — Ambulatory Visit
Admission: RE | Admit: 2021-12-28 | Discharge: 2021-12-28 | Disposition: A | Discharge status: Home / Self Care | Payer: 59 | Source: Ambulatory Visit | Attending: Certified Nurse Midwife | Admitting: Certified Nurse Midwife

## 2021-12-28 DIAGNOSIS — N6489 Other specified disorders of breast: Secondary | ICD-10-CM | POA: Insufficient documentation

## 2021-12-28 DIAGNOSIS — R928 Other abnormal and inconclusive findings on diagnostic imaging of breast: Secondary | ICD-10-CM | POA: Diagnosis present

## 2021-12-28 HISTORY — PX: BREAST BIOPSY: SHX20

## 2021-12-29 LAB — SURGICAL PATHOLOGY

## 2022-01-09 ENCOUNTER — Encounter: Payer: Self-pay | Admitting: Certified Nurse Midwife

## 2022-02-22 ENCOUNTER — Other Ambulatory Visit (HOSPITAL_COMMUNITY)
Admission: RE | Admit: 2022-02-22 | Discharge: 2022-02-22 | Disposition: A | Discharge status: Home / Self Care | Payer: 59 | Source: Ambulatory Visit | Attending: Certified Nurse Midwife | Admitting: Certified Nurse Midwife

## 2022-02-22 ENCOUNTER — Encounter: Payer: Self-pay | Admitting: Certified Nurse Midwife

## 2022-02-22 ENCOUNTER — Ambulatory Visit (INDEPENDENT_AMBULATORY_CARE_PROVIDER_SITE_OTHER): Payer: 59 | Admitting: Certified Nurse Midwife

## 2022-02-22 VITALS — BP 124/77 | HR 82 | Ht 64.0 in | Wt 148.9 lb

## 2022-02-22 DIAGNOSIS — Z124 Encounter for screening for malignant neoplasm of cervix: Secondary | ICD-10-CM

## 2022-02-22 DIAGNOSIS — Z01419 Encounter for gynecological examination (general) (routine) without abnormal findings: Secondary | ICD-10-CM | POA: Diagnosis not present

## 2022-02-22 DIAGNOSIS — Z1231 Encounter for screening mammogram for malignant neoplasm of breast: Secondary | ICD-10-CM

## 2022-02-22 NOTE — Progress Notes (Signed)
GYNECOLOGY ANNUAL PREVENTATIVE CARE ENCOUNTER NOTE  History:     Andrea Velez is a 41 y.o. G83P2002 female here for a routine annual gynecologic exam.  Current complaints: none.   Denies abnormal vaginal bleeding, discharge, pelvic pain, problems with intercourse or other gynecologic concerns.     Social Relationship: married Living: spouse and 2 daughters Work: Optometrist Exercise: pel[employer] bike & barre daily  Smoke/Alcohol/drug use: rare alcohol use   Gynecologic History No LMP recorded. (Menstrual status: IUD). Contraception: IUD, 03/2017 Last Pap: 01/31/2019. Results were: normal  Last mammogram: 12/23/2021 . 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 - NEGATIVE FOR ATYPICAL PROLIFERATIVE BREAST DISEASE  Obstetric History OB History  Gravida Para Term Preterm AB Living  2 2 2     2   SAB IAB Ectopic Multiple Live Births          2    # Outcome Date GA Lbr Len/2nd Weight Sex Delivery Anes PTL Lv  2 Term 2005   7 lb 14 oz (3.572 kg) F Vag-Spont   LIV  1 Term 2002   7 lb 14 oz (3.572 kg) F Vag-Spont   LIV    Past Medical History:  Diagnosis Date   Anxiety    Seasonal allergies    Vitamin D deficiency     Past Surgical History:  Procedure Laterality Date   BREAST BIOPSY Left 12/28/2021   stereo bx, asymmetry, "X" clip-path pending   CHOLECYSTECTOMY  07/18/2007    Current Outpatient Medications on File Prior to Visit  Medication Sig Dispense Refill   levonorgestrel (MIRENA) 20 MCG/24HR IUD 1 each by Intrauterine route once.     Multiple Vitamins-Minerals (MULTIVITAMIN WITH MINERALS) tablet Take 1 tablet by mouth daily.     valACYclovir (VALTREX) 1000 MG tablet Take 1 tablet (1,000 mg total) by mouth 2 (two) times daily. 20 tablet 6   No current facility-administered medications on file prior to visit.    No Known Allergies  Social History:  reports that she has never  smoked. She has never used smokeless tobacco. She reports current alcohol use of about 1.0 - 2.0 standard drink of alcohol per week. She reports that she does not use drugs.  Family History  Problem Relation Age of Onset   Hypertension Mother    Glaucoma Father    Breast cancer Paternal Aunt    Cancer Paternal Aunt        breast   Diabetes Maternal Grandmother    Cancer Paternal Grandfather        colon   Ovarian cancer Neg Hx    Colon cancer Neg Hx     The following portions of the patient's history were reviewed and updated as appropriate: allergies, current medications, past family history, past medical history, past social history, past surgical history and problem list.  Review of Systems Pertinent items noted in HPI and remainder of comprehensive ROS otherwise negative.  Physical Exam:  BP 124/77   Pulse 82   Ht 5\' 4"  (1.626 m)   Wt 148 lb 14.4 oz (67.5 kg)   BMI 25.56 kg/m  CONSTITUTIONAL: Well-developed, well-nourished female in no acute distress.  HENT:  Normocephalic, atraumatic, External right and left ear normal. Oropharynx is clear and moist EYES: Conjunctivae and EOM are normal. Pupils are equal, round, and reactive to light. No scleral icterus.  NECK: Normal range of motion, supple, no masses.  Normal thyroid.  SKIN: Skin is  warm and dry. No rash noted. Not diaphoretic. No erythema. No pallor. MUSCULOSKELETAL: Normal range of motion. No tenderness.  No cyanosis, clubbing, or edema.  2+ distal pulses. NEUROLOGIC: Alert and oriented to person, place, and time. Normal reflexes, muscle tone coordination.  PSYCHIATRIC: Normal mood and affect. Normal behavior. Normal judgment and thought content. CARDIOVASCULAR: Normal heart rate noted, regular rhythm RESPIRATORY: Clear to auscultation bilaterally. Effort and breath sounds normal, no problems with respiration noted. BREASTS: Symmetric in size. No masses, tenderness, skin changes, nipple drainage, or lymphadenopathy  bilaterally.  ABDOMEN: Soft, no distention noted.  No tenderness, rebound or guarding.  PELVIC: Normal appearing external genitalia and urethral meatus; normal appearing vaginal mucosa and cervix.  No abnormal discharge noted.  Pap smear obtained.  IUD strings present. Normal uterine size, no other palpable masses, no uterine or adnexal tenderness.  .   Assessment and Plan:    1. Well woman exam with routine gynecological exam    Pap: Will follow up results of pap smear and manage accordingly. Mammogram : ordered for 2024 Labs: none  Refills: none Referral: none Routine preventative health maintenance measures emphasized. Please refer to After Visit Summary for other counseling recommendations.      Doreene Burke, CNM [facility],  [facility]

## 2022-02-22 NOTE — Addendum Note (Signed)
Addended by: Lady Deutscher on: 02/22/2022 04:36 PM   Modules accepted: Orders

## 2022-02-22 NOTE — Addendum Note (Signed)
Addended by: Lady Deutscher on: 02/22/2022 03:30 PM   Modules accepted: Orders

## 2022-02-23 NOTE — Addendum Note (Signed)
Addended by: Mechele Claude on: 02/23/2022 08:11 AM   Modules accepted: Orders

## 2022-03-01 ENCOUNTER — Encounter: Payer: Self-pay | Admitting: Certified Nurse Midwife

## 2022-03-01 LAB — CYTOLOGY - PAP
Comment: NEGATIVE
Diagnosis: UNDETERMINED — AB
High risk HPV: NEGATIVE

## 2022-09-26 IMAGING — MG MM BREAST LOCALIZATION CLIP
4 series · 5 of 12 positions shown · non-contrast
Comparison: Previous exams.
COMPARISON: Previous exams.

Addendum:
CLINICAL DATA: 40-year-old with a possible developing asymmetry in
the upper LEFT breast at far posterior depth visible only on MLO and
mediolateral views, without sonographic correlate.

EXAM:
LEFT BREAST STEREOTACTIC CORE NEEDLE BIOPSY
DIAGNOSTIC LEFT MAMMOGRAM POST STEREOTACTIC BIOPSY

[L LM synth-2D]
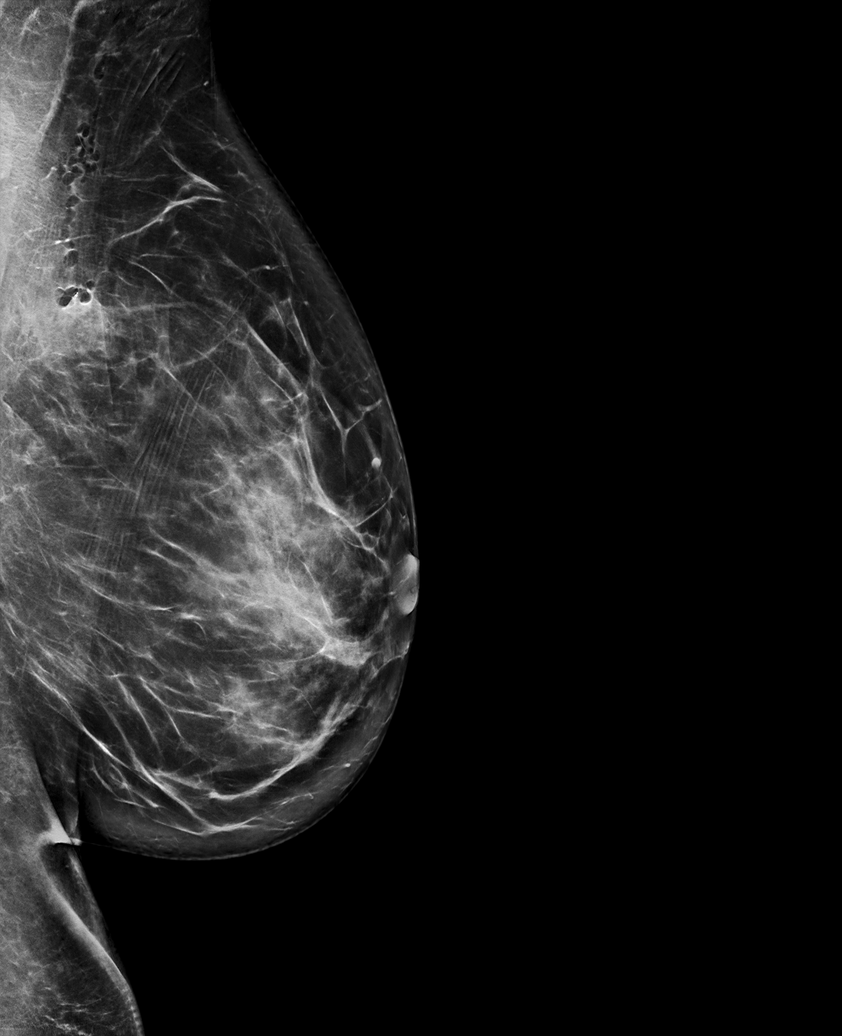

[L CC synth-2D]
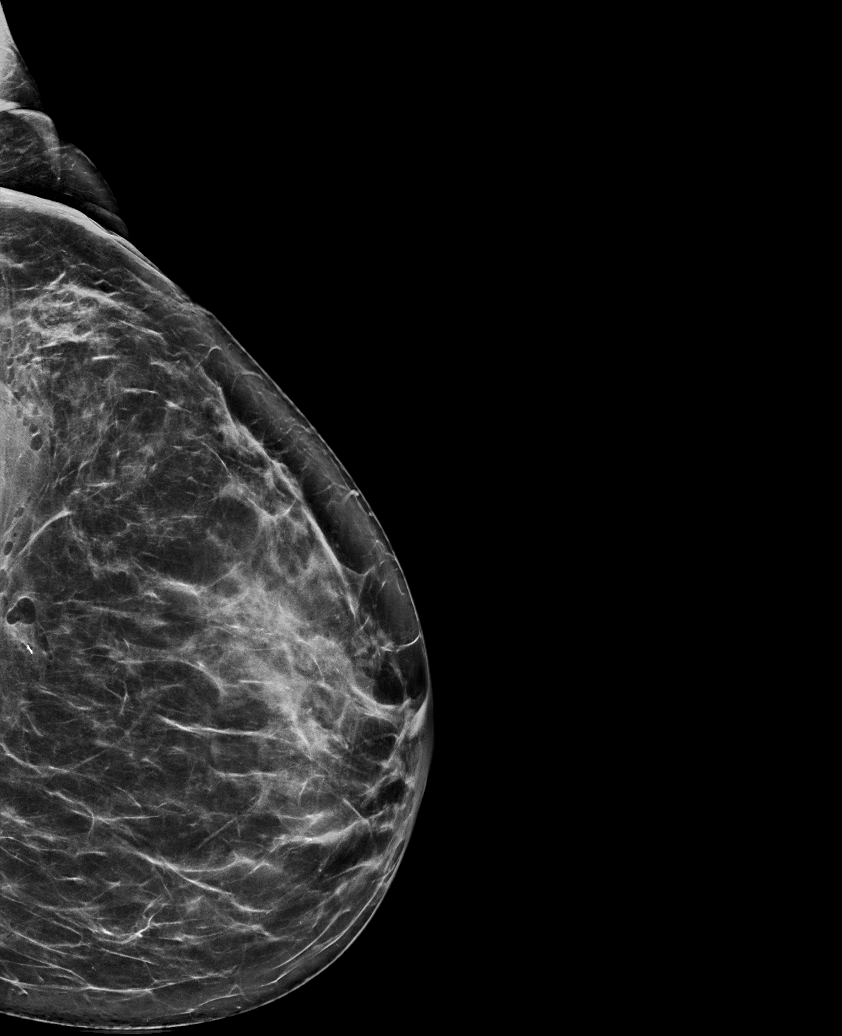

[L LM tomo · 2 of 93 frames shown]
[frame 30/93]
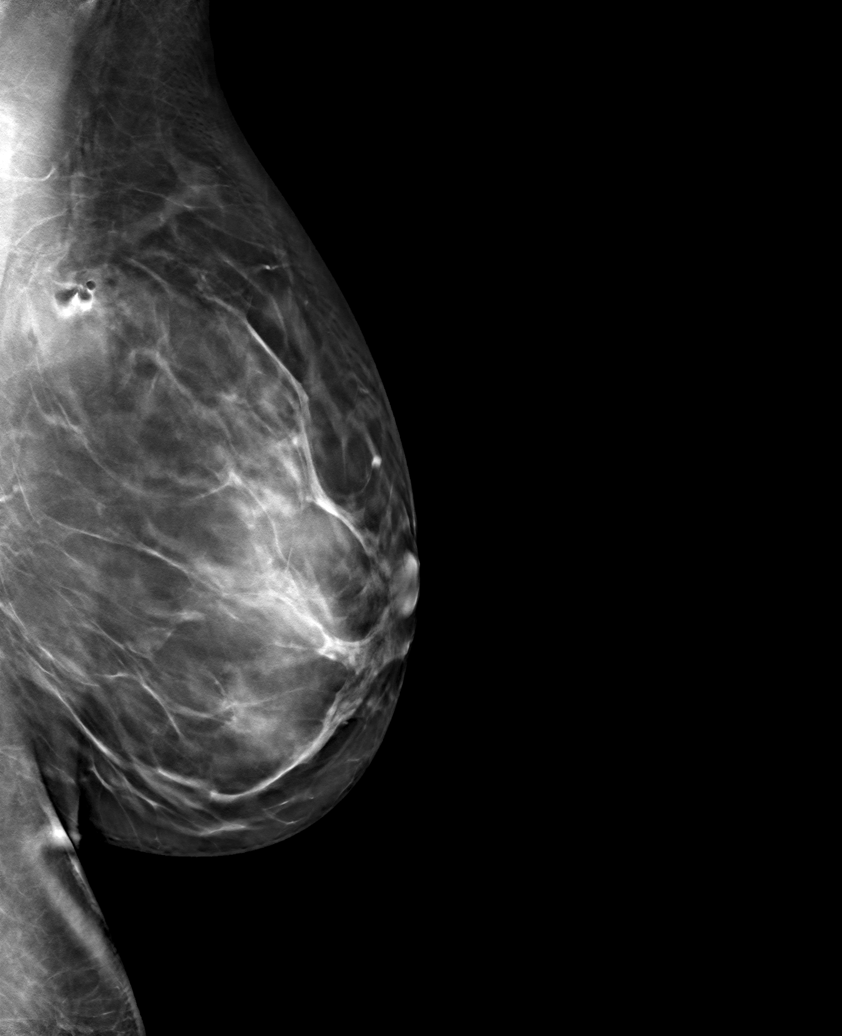
[frame 47/93]
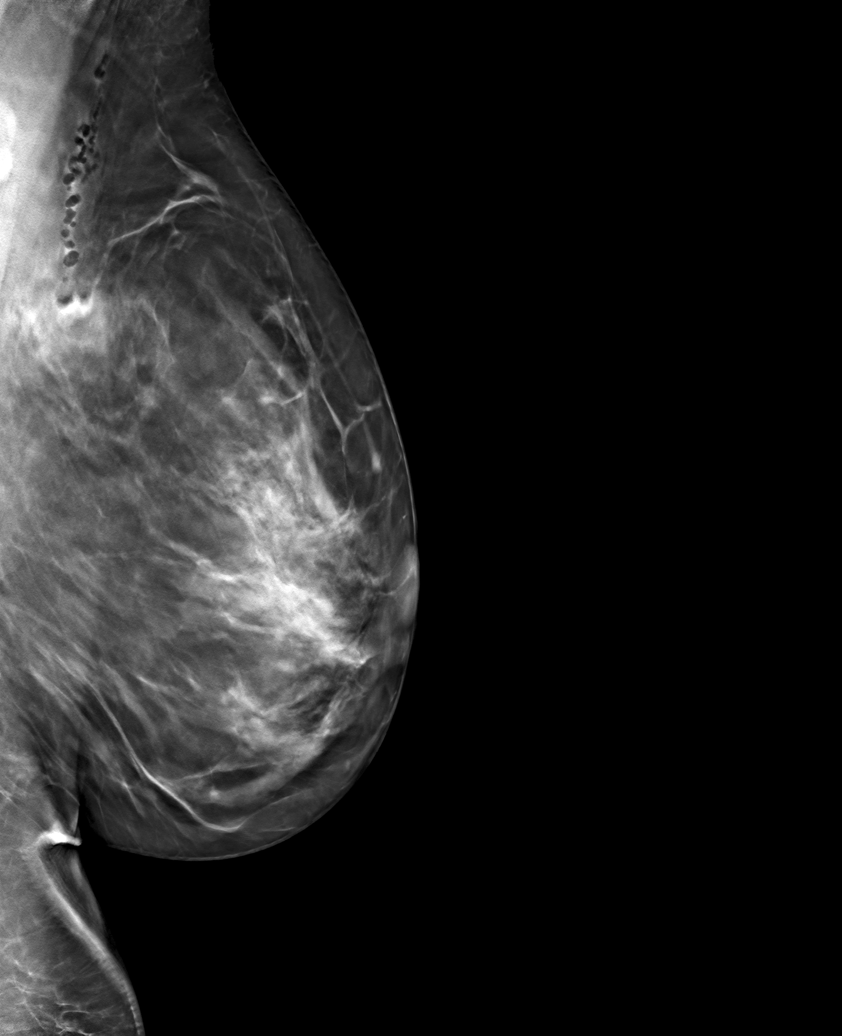

[L CC tomo · tomo slice 40/79.0]
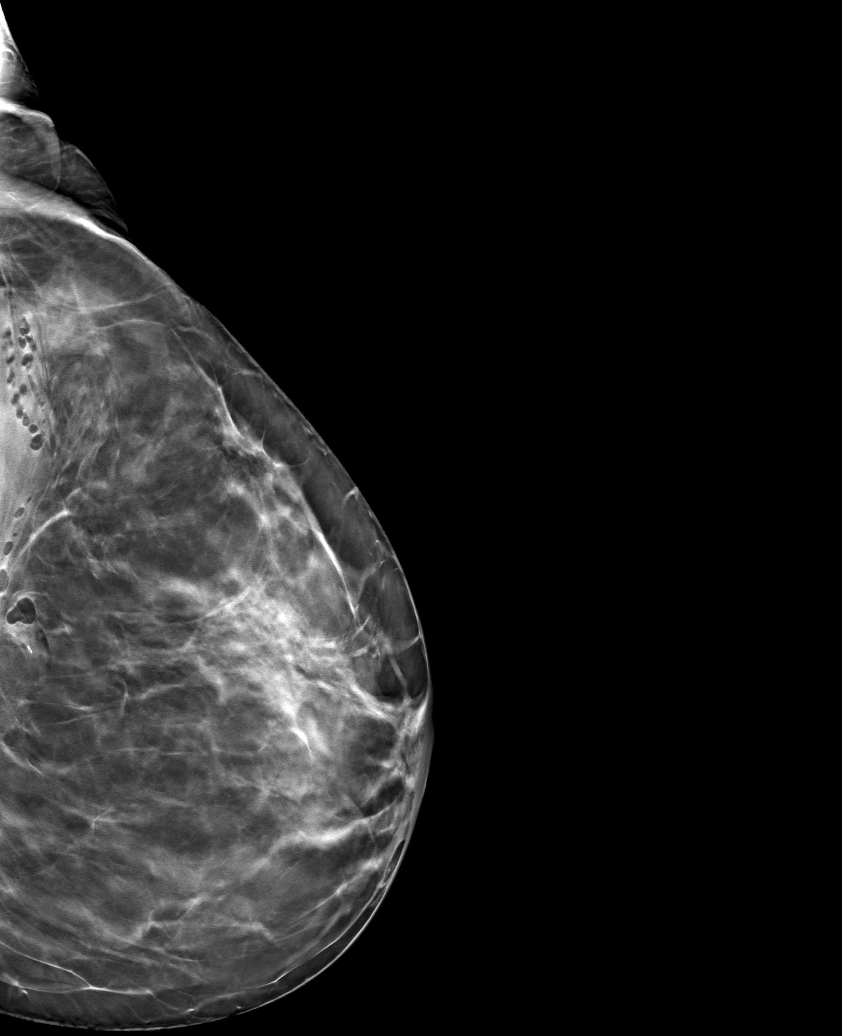

[5 of 12 positions shown; findings below may reference images not displayed]



Lesion quadrant: Upper breast at posterior depth, slight UPPER OUTER
QUADRANT.

Using sterile technique with chlorhexidine as skin antisepsis, 1%
lidocaine and 1% lidocaine with epinephrine as local anesthetic,
under stereotactic tomosynthesis guidance, a 9 gauge Brevera vacuum
assisted device was used to perform core needle biopsy of the
asymmetry in the upper LEFT breast at posterior depth using a
lateral approach. Specimen radiograph was performed showing soft
tissue density in all 6 of the core samples. At the conclusion of
the procedure, an X shaped tissue marker clip was deployed into the
biopsy cavity.

Follow-up 2D and 3D full field laterally exaggerated CC and
mediolateral mammographic images were obtained in order to confirm
clip placement. The coil shaped tissue marking clip is appropriately
positioned at the site of the biopsied asymmetry in the upper breast
at posterior depth, localizing to the slight outer breast on the
laterally exaggerated CC view, therefore UPPER OUTER QUADRANT.
Expected post biopsy changes are present without evidence of
hematoma.
IMPRESSION: 1. Stereotactic tomosynthesis guided core needle biopsy of a
possible developing asymmetry in the upper LEFT breast at posterior
depth. No apparent complications.
2. Appropriate positioning of the X shaped tissue marking clip at
the site of the biopsied asymmetry in the upper breast at posterior
depth, localizing to the slight UPPER OUTER QUADRANT.

ADDENDUM:
Pathology revealed: BREAST, LEFT UPPER; STEREOTACTIC CORE NEEDLE
BIOPSY: BENIGN MAMMARY PARENCHYMA WITH PSEUDOANGIOMATOUS STROMAL
HYPERPLASIA. - NEGATIVE FOR ATYPICAL PROLIFERATIVE BREAST DISEASE (X
clip). This was found to be concordant by Dr. Kirja Dyachkov.

Pathology results were discussed with the patient by telephone. The
patient reported doing well after the biopsy with tenderness at the
site. Post biopsy instructions and care were reviewed and questions
were answered. The patient was encouraged to call [facility]HOSPITAL[facility] for any additional
concerns.

The patient was instructed to return for annual screening
mammography and was informed a reminder notice would be sent
regarding this appointment.

Pathology results reported by Marilys Gonin RN on 12/29/2021.



Lesion quadrant: Upper breast at posterior depth, slight UPPER OUTER
QUADRANT.

Using sterile technique with chlorhexidine as skin antisepsis, 1%
lidocaine and 1% lidocaine with epinephrine as local anesthetic,
under stereotactic tomosynthesis guidance, a 9 gauge Brevera vacuum
assisted device was used to perform core needle biopsy of the
asymmetry in the upper LEFT breast at posterior depth using a
lateral approach. Specimen radiograph was performed showing soft
tissue density in all 6 of the core samples. At the conclusion of
the procedure, an X shaped tissue marker clip was deployed into the
biopsy cavity.

Follow-up 2D and 3D full field laterally exaggerated CC and
mediolateral mammographic images were obtained in order to confirm
clip placement. The coil shaped tissue marking clip is appropriately
positioned at the site of the biopsied asymmetry in the upper breast
at posterior depth, localizing to the slight outer breast on the
laterally exaggerated CC view, therefore UPPER OUTER QUADRANT.
Expected post biopsy changes are present without evidence of
hematoma.
IMPRESSION: 1. Stereotactic tomosynthesis guided core needle biopsy of a
possible developing asymmetry in the upper LEFT breast at posterior
depth. No apparent complications.
2. Appropriate positioning of the X shaped tissue marking clip at
the site of the biopsied asymmetry in the upper breast at posterior
depth, localizing to the slight UPPER OUTER QUADRANT.

## 2022-09-26 IMAGING — MG MM BREAST BX W LOC DEV 1ST LESION IMAGE BX SPEC STEREO GUIDE*L*
7 of 11 series · 7 of 19 positions shown · non-contrast
Comparison: Previous exams.
COMPARISON: Previous exams.

Addendum:
CLINICAL DATA: 40-year-old with a possible developing asymmetry in
the upper LEFT breast at far posterior depth visible only on MLO and
mediolateral views, without sonographic correlate.

EXAM:
LEFT BREAST STEREOTACTIC CORE NEEDLE BIOPSY
DIAGNOSTIC LEFT MAMMOGRAM POST STEREOTACTIC BIOPSY

[L (1 of 6)]
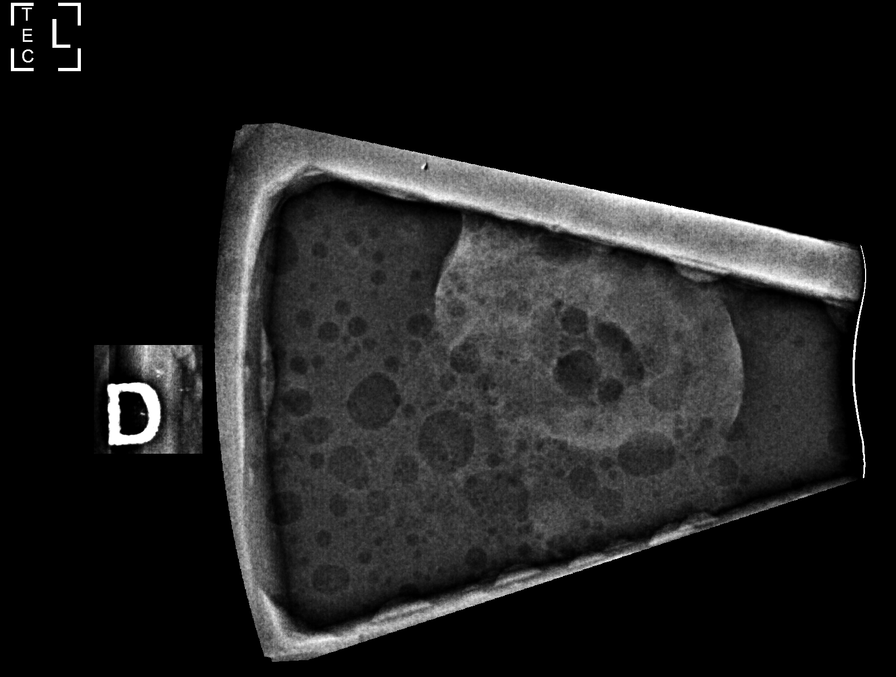

[L (2 of 6)]
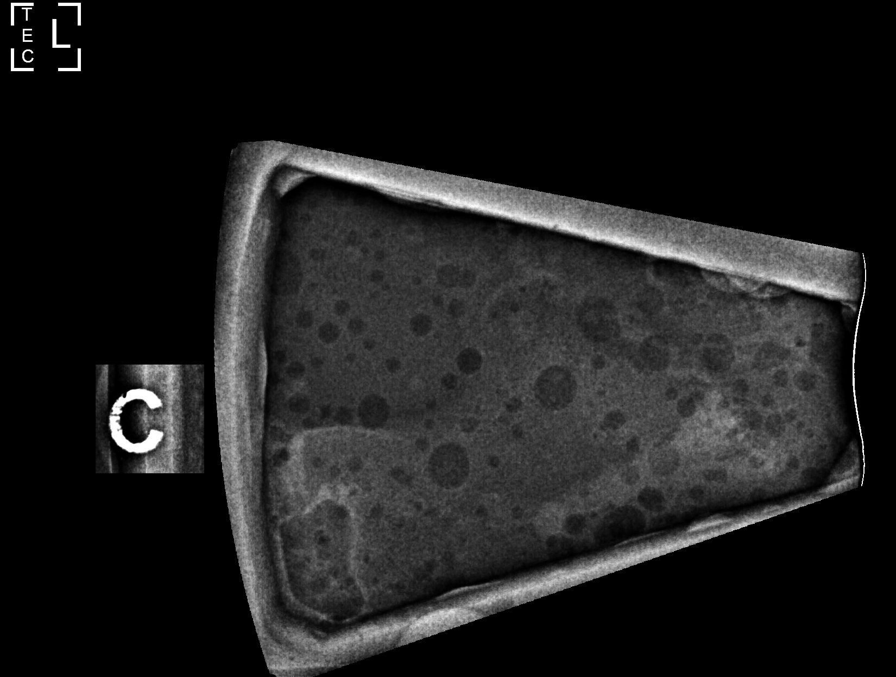

[L (3 of 6)]
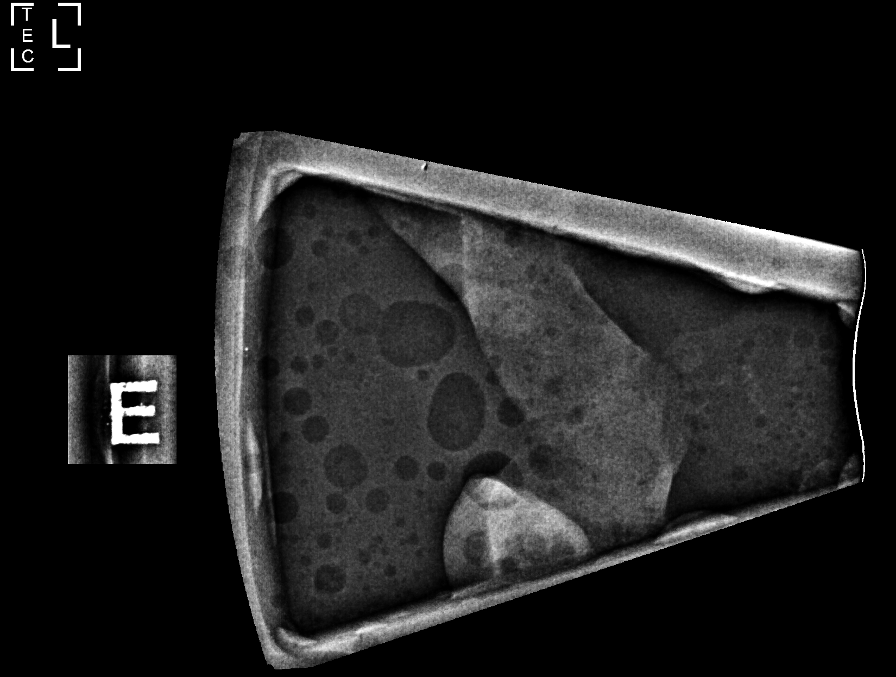

[L (4 of 6)]
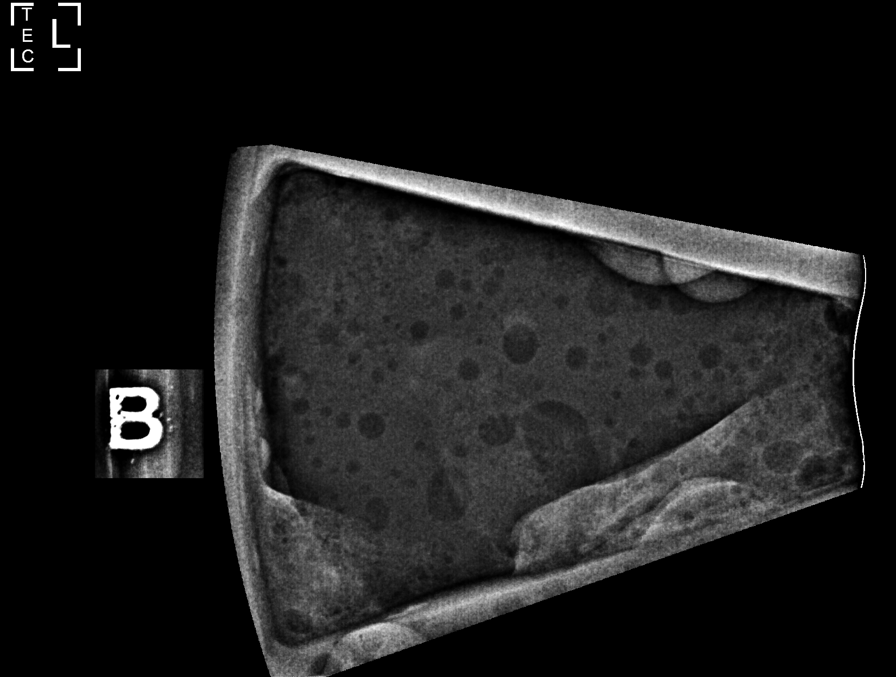

[L (5 of 6)]
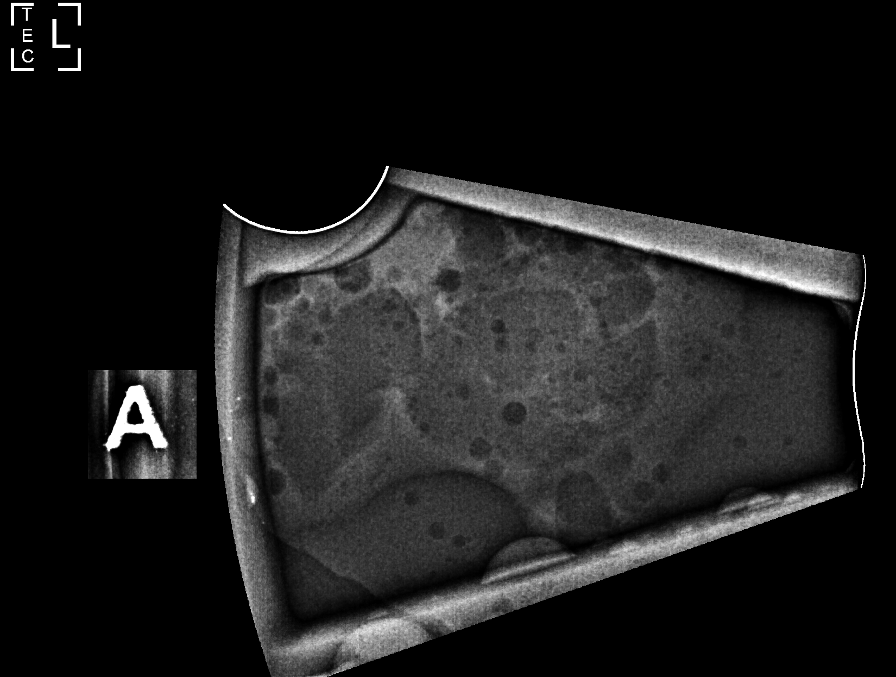

[L (6 of 6)]
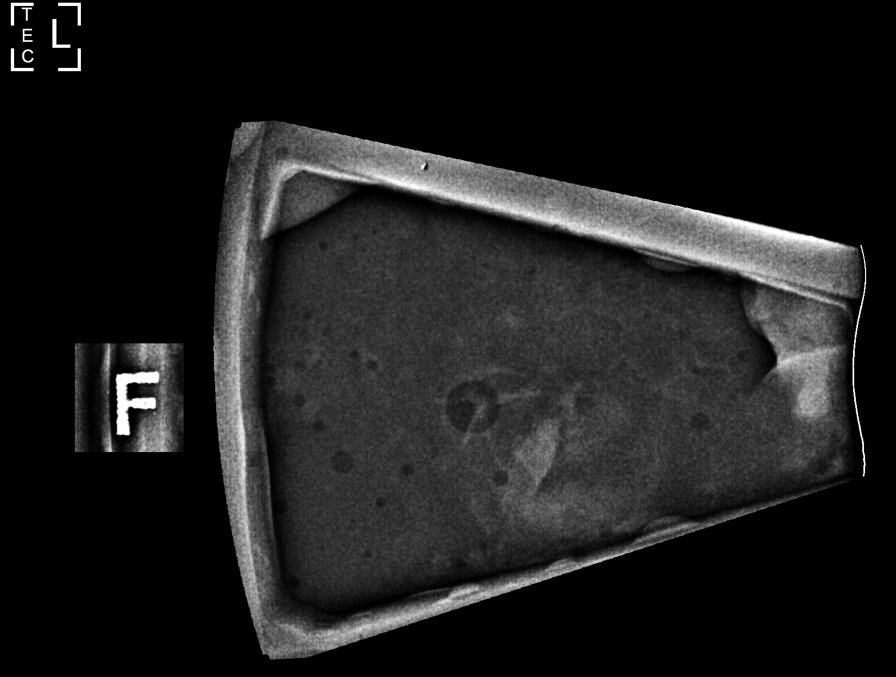

[L LM]
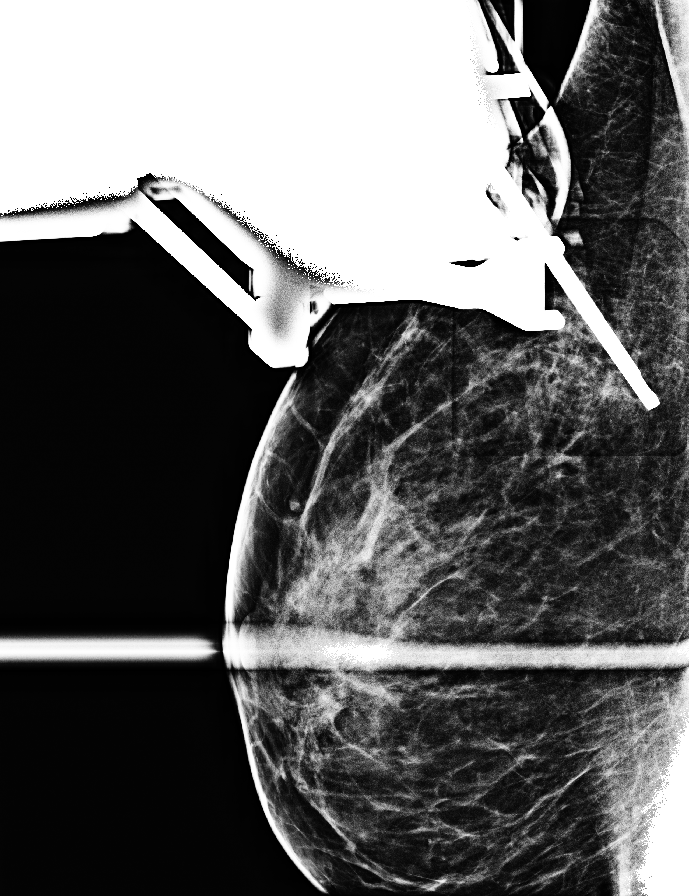

[7 of 19 positions shown; findings below may reference images not displayed]



Lesion quadrant: Upper breast at posterior depth, slight UPPER OUTER
QUADRANT.

Using sterile technique with chlorhexidine as skin antisepsis, 1%
lidocaine and 1% lidocaine with epinephrine as local anesthetic,
under stereotactic tomosynthesis guidance, a 9 gauge Brevera vacuum
assisted device was used to perform core needle biopsy of the
asymmetry in the upper LEFT breast at posterior depth using a
lateral approach. Specimen radiograph was performed showing soft
tissue density in all 6 of the core samples. At the conclusion of
the procedure, an X shaped tissue marker clip was deployed into the
biopsy cavity.

Follow-up 2D and 3D full field laterally exaggerated CC and
mediolateral mammographic images were obtained in order to confirm
clip placement. The coil shaped tissue marking clip is appropriately
positioned at the site of the biopsied asymmetry in the upper breast
at posterior depth, localizing to the slight outer breast on the
laterally exaggerated CC view, therefore UPPER OUTER QUADRANT.
Expected post biopsy changes are present without evidence of
hematoma.
IMPRESSION: 1. Stereotactic tomosynthesis guided core needle biopsy of a
possible developing asymmetry in the upper LEFT breast at posterior
depth. No apparent complications.
2. Appropriate positioning of the X shaped tissue marking clip at
the site of the biopsied asymmetry in the upper breast at posterior
depth, localizing to the slight UPPER OUTER QUADRANT.

ADDENDUM:
Pathology revealed: BREAST, LEFT UPPER; STEREOTACTIC CORE NEEDLE
BIOPSY: BENIGN MAMMARY PARENCHYMA WITH PSEUDOANGIOMATOUS STROMAL
HYPERPLASIA. - NEGATIVE FOR ATYPICAL PROLIFERATIVE BREAST DISEASE (X
clip). This was found to be concordant by Dr. Kirja Dyachkov.

Pathology results were discussed with the patient by telephone. The
patient reported doing well after the biopsy with tenderness at the
site. Post biopsy instructions and care were reviewed and questions
were answered. The patient was encouraged to call [HOSPITAL[facility]] for any additional
concerns.

The patient was instructed to return for annual screening
mammography and was informed a reminder notice would be sent
regarding this appointment.

Pathology results reported by Marilys Gonin RN on 12/29/2021.



Lesion quadrant: Upper breast at posterior depth, slight UPPER OUTER
QUADRANT.

Using sterile technique with chlorhexidine as skin antisepsis, 1%
lidocaine and 1% lidocaine with epinephrine as local anesthetic,
under stereotactic tomosynthesis guidance, a 9 gauge Brevera vacuum
assisted device was used to perform core needle biopsy of the
asymmetry in the upper LEFT breast at posterior depth using a
lateral approach. Specimen radiograph was performed showing soft
tissue density in all 6 of the core samples. At the conclusion of
the procedure, an X shaped tissue marker clip was deployed into the
biopsy cavity.

Follow-up 2D and 3D full field laterally exaggerated CC and
mediolateral mammographic images were obtained in order to confirm
clip placement. The coil shaped tissue marking clip is appropriately
positioned at the site of the biopsied asymmetry in the upper breast
at posterior depth, localizing to the slight outer breast on the
laterally exaggerated CC view, therefore UPPER OUTER QUADRANT.
Expected post biopsy changes are present without evidence of
hematoma.
IMPRESSION: 1. Stereotactic tomosynthesis guided core needle biopsy of a
possible developing asymmetry in the upper LEFT breast at posterior
depth. No apparent complications.
2. Appropriate positioning of the X shaped tissue marking clip at
the site of the biopsied asymmetry in the upper breast at posterior
depth, localizing to the slight UPPER OUTER QUADRANT.

## 2022-12-28 ENCOUNTER — Ambulatory Visit
Admission: RE | Admit: 2022-12-28 | Discharge: 2022-12-28 | Disposition: A | Discharge status: Home / Self Care | Payer: 59 | Source: Ambulatory Visit | Attending: Certified Nurse Midwife | Admitting: Certified Nurse Midwife

## 2022-12-28 DIAGNOSIS — Z01419 Encounter for gynecological examination (general) (routine) without abnormal findings: Secondary | ICD-10-CM | POA: Insufficient documentation

## 2022-12-28 DIAGNOSIS — Z1231 Encounter for screening mammogram for malignant neoplasm of breast: Secondary | ICD-10-CM | POA: Insufficient documentation

## 2023-02-26 ENCOUNTER — Encounter: Payer: Self-pay | Admitting: Certified Nurse Midwife

## 2023-02-26 ENCOUNTER — Ambulatory Visit (INDEPENDENT_AMBULATORY_CARE_PROVIDER_SITE_OTHER): Payer: 59 | Admitting: Certified Nurse Midwife

## 2023-02-26 VITALS — BP 124/82 | HR 96 | Ht 64.0 in | Wt 157.6 lb

## 2023-02-26 DIAGNOSIS — Z1231 Encounter for screening mammogram for malignant neoplasm of breast: Secondary | ICD-10-CM

## 2023-02-26 DIAGNOSIS — R232 Flushing: Secondary | ICD-10-CM

## 2023-02-26 DIAGNOSIS — Z01419 Encounter for gynecological examination (general) (routine) without abnormal findings: Secondary | ICD-10-CM

## 2023-02-26 DIAGNOSIS — R5383 Other fatigue: Secondary | ICD-10-CM

## 2023-02-26 DIAGNOSIS — Z833 Family history of diabetes mellitus: Secondary | ICD-10-CM

## 2023-02-26 DIAGNOSIS — R635 Abnormal weight gain: Secondary | ICD-10-CM

## 2023-02-26 NOTE — Progress Notes (Addendum)
GYNECOLOGY ANNUAL PREVENTATIVE CARE ENCOUNTER NOTE  History:     Andrea Velez is a 42 y.o. G35P2002 female here for a routine annual gynecologic exam.  Current complaints: fatigue, weight gain, low libido, vaginal dryness, hot flashes. She has also had GI upset and bloating.  Denies abnormal vaginal bleeding, discharge, pelvic pain, problems with intercourse or other gynecologic concerns.     Social Relationship: Married  Living:spouse and children Work: Psychologist, educational and billing  Exercise: [employer] , barre daily , walking  Smoke/Alcohol/drug use: rare alcohol use    Gynecologic History No LMP recorded. (Menstrual status: IUD). Contraception: IUD Last Pap: 02/22/2022. Results were: ASCUS with negative HPV Last mammogram: 12/28/2022. Results were: normal   Upstream - 02/26/23 1333       Pregnancy Intention Screening   Does the patient want to become pregnant in the next year? No    Does the patient's partner want to become pregnant in the next year? No    Would the patient like to discuss contraceptive options today? No      Contraception Wrap Up   Current Method IUD or IUS    End Method IUD or IUS    Contraception Counseling Provided No            The pregnancy intention screening data noted above was reviewed. Potential methods of contraception were discussed. The patient elected to proceed with IUD or IUS.  Obstetric History OB History  Gravida Para Term Preterm AB Living  2 2 2     2   SAB IAB Ectopic Multiple Live Births          2    # Outcome Date GA Lbr Len/2nd Weight Sex Type Anes PTL Lv  2 Term 2005   7 lb 14 oz (3.572 kg) F Vag-Spont   LIV  1 Term 2002   7 lb 14 oz (3.572 kg) F Vag-Spont   LIV    Past Medical History:  Diagnosis Date   Anxiety    Seasonal allergies    Vitamin D deficiency     Past Surgical History:  Procedure Laterality Date   BREAST BIOPSY Left 12/28/2021   stereo bx, asymmetry, "X" clip-path pending    CHOLECYSTECTOMY  07/18/2007    Current Outpatient Medications on File Prior to Visit  Medication Sig Dispense Refill   levonorgestrel (MIRENA) 20 MCG/24HR IUD 1 each by Intrauterine route once.     Multiple Vitamins-Minerals (MULTIVITAMIN WITH MINERALS) tablet Take 1 tablet by mouth daily.     valACYclovir (VALTREX) 1000 MG tablet Take 1 tablet (1,000 mg total) by mouth 2 (two) times daily. 20 tablet 6   No current facility-administered medications on file prior to visit.    Allergies  Allergen Reactions   Other     Hazelnut     Social History:  reports that she has never smoked. She has never used smokeless tobacco. She reports current alcohol use of about 1.0 - 2.0 standard drink of alcohol per week. She reports that she does not use drugs.  Family History  Problem Relation Age of Onset   Hypertension Mother    Glaucoma Father    Breast cancer Paternal Aunt    Cancer Paternal Aunt        breast   Diabetes Maternal Grandmother    Cancer Paternal Grandfather        colon   Ovarian cancer Neg Hx    Colon cancer  Neg Hx     The following portions of the patient's history were reviewed and updated as appropriate: allergies, current medications, past family history, past medical history, past social history, past surgical history and problem list.  Review of Systems Pertinent items noted in HPI and remainder of comprehensive ROS otherwise negative.  Physical Exam:  BP 124/82   Pulse 96   Ht 5\' 4"  (1.626 m)   Wt 157 lb 9.6 oz (71.5 kg)   BMI 27.05 kg/m  CONSTITUTIONAL: Well-developed, well-nourished female in no acute distress.  HENT:  Normocephalic, atraumatic, External right and left ear normal. Oropharynx is clear and moist EYES: Conjunctivae and EOM are normal. Pupils are equal, round, and reactive to light. No scleral icterus.  NECK: Normal range of motion, supple, no masses.  Normal thyroid.  SKIN: Skin is warm and dry. No rash noted. Not diaphoretic. No erythema.  No pallor. MUSCULOSKELETAL: Normal range of motion. No tenderness.  No cyanosis, clubbing, or edema.  2+ distal pulses. NEUROLOGIC: Alert and oriented to person, place, and time. Normal reflexes, muscle tone coordination.  PSYCHIATRIC: Normal mood and affect. Normal behavior. Normal judgment and thought content. CARDIOVASCULAR: Normal heart rate noted, regular rhythm RESPIRATORY: Clear to auscultation bilaterally. Effort and breath sounds normal, no problems with respiration noted. BREASTS: Symmetric in size. No masses, tenderness, skin changes, nipple drainage, or lymphadenopathy bilaterally.  ABDOMEN: Soft, no distention noted.  No tenderness, rebound or guarding.  PELVIC: Normal appearing external genitalia and urethral meatus; normal appearing vaginal mucosa and cervix.  No abnormal discharge noted.  Pap smear not due .  Normal uterine size, no other palpable masses, no uterine or adnexal tenderness.  .   Assessment and Plan:    1. Women's annual routine gynecological examination  Pap: not due until 2026 Mammogram : ordered for next year  Labs: Hem A1c, TSH, free T4, iron, cbc, estradiol , FSH  Refills: none  Referral: none  Discussed referral for GI discussed.  Routine preventative health maintenance measures emphasized. Please refer to After Visit Summary for other counseling recommendations.      Doreene Burke, CNM [facility]  [facility],  [facility]

## 2023-02-26 NOTE — Patient Instructions (Signed)
Preventive Care 43-43 Years Old, Female Preventive care refers to lifestyle choices and visits with your health care provider that can promote health and wellness. Preventive care visits are also called wellness exams. What can I expect for my preventive care visit? Counseling Your health care provider may ask you questions about your: Medical history, including: Past medical problems. Family medical history. Pregnancy history. Current health, including: Menstrual cycle. Method of birth control. Emotional well-being. Home life and relationship well-being. Sexual activity and sexual health. Lifestyle, including: Alcohol, nicotine or tobacco, and drug use. Access to firearms. Diet, exercise, and sleep habits. Work and work environment. Sunscreen use. Safety issues such as seatbelt and bike helmet use. Physical exam Your health care provider will check your: Height and weight. These may be used to calculate your BMI (body mass index). BMI is a measurement that tells if you are at a healthy weight. Waist circumference. This measures the distance around your waistline. This measurement also tells if you are at a healthy weight and may help predict your risk of certain diseases, such as type 2 diabetes and high blood pressure. Heart rate and blood pressure. Body temperature. Skin for abnormal spots. What immunizations do I need?  Vaccines are usually given at various ages, according to a schedule. Your health care provider will recommend vaccines for you based on your age, medical history, and lifestyle or other factors, such as travel or where you work. What tests do I need? Screening Your health care provider may recommend screening tests for certain conditions. This may include: Lipid and cholesterol levels. Diabetes screening. This is done by checking your blood sugar (glucose) after you have not eaten for a while (fasting). Pelvic exam and Pap test. Hepatitis B test. Hepatitis C  test. HIV (human immunodeficiency virus) test. STI (sexually transmitted infection) testing, if you are at risk. Lung cancer screening. Colorectal cancer screening. Mammogram. Talk with your health care provider about when you should start having regular mammograms. This may depend on whether you have a family history of breast cancer. BRCA-related cancer screening. This may be done if you have a family history of breast, ovarian, tubal, or peritoneal cancers. Bone density scan. This is done to screen for osteoporosis. Talk with your health care provider about your test results, treatment options, and if necessary, the need for more tests. Follow these instructions at home: Eating and drinking  Eat a diet that includes fresh fruits and vegetables, whole grains, lean protein, and low-fat dairy products. Take vitamin and mineral supplements as recommended by your health care provider. Do not drink alcohol if: Your health care provider tells you not to drink. You are pregnant, may be pregnant, or are planning to become pregnant. If you drink alcohol: Limit how much you have to 0-1 drink a day. Know how much alcohol is in your drink. In the [place] one drink equals one 12 oz bottle of beer (355 mL), one 5 oz glass of wine (148 mL), or one 1 oz glass of hard liquor (44 mL). Lifestyle Brush your teeth every morning and night with fluoride toothpaste. Floss one time each day. Exercise for at least 30 minutes 5 or more days each week. Do not use any products that contain nicotine or tobacco. These products include cigarettes, chewing tobacco, and vaping devices, such as e-cigarettes. If you need help quitting, ask your health care provider. Do not use drugs. If you are sexually active, practice safe sex. Use a condom or other form of protection to   prevent STIs. If you do not wish to become pregnant, use a form of birth control. If you plan to become pregnant, see your health care provider for a  prepregnancy visit. Take aspirin only as told by your health care provider. Make sure that you understand how much to take and what form to take. Work with your health care provider to find out whether it is safe and beneficial for you to take aspirin daily. Find healthy ways to manage stress, such as: Meditation, yoga, or listening to music. Journaling. Talking to a trusted person. Spending time with friends and family. Minimize exposure to UV radiation to reduce your risk of skin cancer. Safety Always wear your seat belt while driving or riding in a vehicle. Do not drive: If you have been drinking alcohol. Do not ride with someone who has been drinking. When you are tired or distracted. While texting. If you have been using any mind-altering substances or drugs. Wear a helmet and other protective equipment during sports activities. If you have firearms in your house, make sure you follow all gun safety procedures. Seek help if you have been physically or sexually abused. What's next? Visit your health care provider once a year for an annual wellness visit. Ask your health care provider how often you should have your eyes and teeth checked. Stay up to date on all vaccines. This information is not intended to replace advice given to you by your health care provider. Make sure you discuss any questions you have with your health care provider. Document Revised: 12/29/2020 Document Reviewed: 12/29/2020 Elsevier Patient Education  2024 Elsevier Inc.  

## 2023-02-27 ENCOUNTER — Encounter: Payer: Self-pay | Admitting: Certified Nurse Midwife

## 2023-03-02 ENCOUNTER — Other Ambulatory Visit: Payer: Self-pay | Admitting: Certified Nurse Midwife

## 2023-03-02 DIAGNOSIS — R635 Abnormal weight gain: Secondary | ICD-10-CM

## 2023-03-02 DIAGNOSIS — K30 Functional dyspepsia: Secondary | ICD-10-CM

## 2023-03-02 DIAGNOSIS — R140 Abdominal distension (gaseous): Secondary | ICD-10-CM

## 2023-03-02 NOTE — Progress Notes (Signed)
Orders placed for referral to GI as discussed at annual exam for bloating , GI upset, and weight gain.   Doreene Burke, CNM

## 2023-03-22 ENCOUNTER — Encounter: Payer: Self-pay | Admitting: Physician Assistant

## 2023-03-22 ENCOUNTER — Ambulatory Visit (INDEPENDENT_AMBULATORY_CARE_PROVIDER_SITE_OTHER): Payer: 59 | Admitting: Physician Assistant

## 2023-03-22 VITALS — BP 125/81 | HR 83 | Temp 99.1°F | Ht 64.0 in | Wt 154.0 lb

## 2023-03-22 DIAGNOSIS — K219 Gastro-esophageal reflux disease without esophagitis: Secondary | ICD-10-CM

## 2023-03-22 DIAGNOSIS — K912 Postsurgical malabsorption, not elsewhere classified: Secondary | ICD-10-CM | POA: Diagnosis not present

## 2023-03-22 DIAGNOSIS — K9089 Other intestinal malabsorption: Secondary | ICD-10-CM

## 2023-03-22 DIAGNOSIS — R142 Eructation: Secondary | ICD-10-CM

## 2023-03-22 DIAGNOSIS — R1013 Epigastric pain: Secondary | ICD-10-CM

## 2023-03-22 MED ORDER — OMEPRAZOLE 20 MG PO CPDR
20.0000 mg | DELAYED_RELEASE_CAPSULE | Freq: Every day | ORAL | Status: DC
Start: 2023-03-22 — End: 2023-05-03

## 2023-03-22 MED ORDER — CHOLESTYRAMINE 4 G PO PACK
4.0000 g | PACK | Freq: Every day | ORAL | 2 refills | Status: DC
Start: 2023-03-22 — End: 2023-05-03

## 2023-03-22 NOTE — Progress Notes (Signed)
Celso Amy, PA-C [address]  [address]  [place], [place] [address]  Main: [phone]  Fax: [phone]   Gastroenterology Consultation  Referring Provider:     Doreene Burke, CNM Primary Care Physician:  Doreene Burke, CNM Primary Gastroenterologist:  Celso Amy, PA-C  Reason for Consultation:     Bloating, weight gain        HPI:   Andrea Velez is a 42 y.o. y/o female referred for consultation & management  by Doreene Burke, CNM.    Patient states she has had heartburn, belching, and upper abdominal bloating since March 2024.  In March she took OTC omeprazole for 2 weeks and her symptoms resolved.  She stopped PPI.  She did well for several months and then had recurrent upper GI symptoms 2 months ago.  She is having epigastric bloating and discomfort, dyspepsia, increased belching.  She recently has been taking Tums as needed.  Has also tried changing her diet which has helped some.  She has had 9 pound weight gain in the past year.  Denies unintentional weight loss, solid food dysphagia, nausea or vomiting.  No tobacco use.  Drinks 1 or 2 alcoholic beverages per week.  Cholecystectomy in 2009.  She has had chronic loose stools since then.  Labs 02/26/2023 showed normal CBC, TSH, free T4, and iron.  Hemoglobin 13.2.  No prior GI evaluation.  No recent abdominal imaging.   Past Medical History:  Diagnosis Date   Anxiety    Seasonal allergies    Vitamin D deficiency     Past Surgical History:  Procedure Laterality Date   BREAST BIOPSY Left 12/28/2021   stereo bx, asymmetry, "X" clip-path pending   CHOLECYSTECTOMY  07/18/2007    Prior to Admission medications   Medication Sig Start Date End Date Taking? Authorizing Provider  levonorgestrel (MIRENA) 20 MCG/24HR IUD 1 each by Intrauterine route once.    [provider]  Multiple Vitamins-Minerals (MULTIVITAMIN WITH MINERALS) tablet Take 1 tablet by mouth daily.    [provider]     Family History  Problem Relation Age of Onset   Hypertension Mother    Glaucoma Father    Breast cancer Paternal Aunt    Cancer Paternal Aunt        breast   Diabetes Maternal Grandmother    Cancer Paternal Grandfather        colon   Ovarian cancer Neg Hx    Colon cancer Neg Hx      Social History   Tobacco Use   Smoking status: Never   Smokeless tobacco: Never  Vaping Use   Vaping status: Never Used  Substance Use Topics   Alcohol use: Yes    Alcohol/week: 1.0 - 2.0 standard drink of alcohol    Types: 1 - 2 Glasses of wine per week    Comment: rare   Drug use: No    Allergies as of 03/22/2023 - Review Complete 03/22/2023  Allergen Reaction Noted   Other  02/26/2023    Review of Systems:    All systems reviewed and negative except where noted in HPI.   Physical Exam:  BP 125/81   Pulse 83   Temp 99.1 F (37.3 C)   Ht 5\' 4"  (1.626 m)   Wt 154 lb (69.9 kg)   BMI 26.43 kg/m  No LMP recorded. (Menstrual status: IUD).  General:   Alert,  Well-developed, well-nourished, pleasant and cooperative in NAD Lungs:  Respirations even and unlabored.  Clear throughout to auscultation.   No wheezes, crackles, or rhonchi. No acute distress. Heart:  Regular rate and rhythm; no murmurs, clicks, rubs, or gallops. Abdomen:  Normal bowel sounds.  No bruits.  Soft, and non-distended without masses, hepatosplenomegaly or hernias noted.  Mild epigastric and generalized upper abdominal tenderness.  No lower abdominal tenderness.  No guarding or rebound tenderness.    Neurologic:  Alert and oriented x3;  grossly normal neurologically. Psych:  Alert and cooperative. Normal mood and affect.  Imaging Studies: No results found.  Assessment and Plan:   Andrea Velez is a 42 y.o. y/o female has been referred for upper GI symptoms which are most consistent with acid reflux.  Symptoms are also consistent with bile salt diarrhea postcholecystectomy.  I am giving trial of treatment  with follow-up.  She has no alarm symptoms.  1.  Acid reflux  Start OTC omeprazole 20 Mg 1 tablet once daily, take 30 minutes before meal.  Add OTC Pepcid 20 Mg twice daily or Tums antacid as needed.  Recommend Lifestyle Modifications to prevent Acid Reflux.  Rec. Avoid coffee, sodas, peppermint, citrus fruits, and spicey foods.  Avoid eating 2-3 hours before bedtime.   2.  Dyspepsia and belching  Perform H. pylori breath test today  3.  Bile salt diarrhea postcholecystectomy  Trial of cholestyramine powder, mix half to 1 packet in a drink once daily   Follow up 6 weeks with TG.  Celso Amy, PA-C

## 2023-03-22 NOTE — Patient Instructions (Signed)
Start OTC omeprazole 20 Mg 1 tablet once daily, take 30 minutes before meal.  Add OTC Pepcid 20 Mg twice daily or Tums antacid as needed.   Recommend Lifestyle Modifications to prevent Acid Reflux.  Rec. Avoid coffee, sodas, peppermint, citrus fruits, and spicey foods.  Avoid eating 2-3 hours before bedtime.

## 2023-03-24 LAB — H. PYLORI BREATH TEST: H pylori Breath Test: NEGATIVE

## 2023-05-03 ENCOUNTER — Ambulatory Visit: Payer: 59 | Admitting: Physician Assistant

## 2023-05-03 ENCOUNTER — Encounter: Payer: Self-pay | Admitting: Physician Assistant

## 2023-05-03 VITALS — BP 120/80 | HR 70 | Temp 98.2°F | Ht 64.0 in | Wt 156.4 lb

## 2023-05-03 DIAGNOSIS — K219 Gastro-esophageal reflux disease without esophagitis: Secondary | ICD-10-CM

## 2023-05-03 DIAGNOSIS — K912 Postsurgical malabsorption, not elsewhere classified: Secondary | ICD-10-CM | POA: Diagnosis not present

## 2023-05-03 DIAGNOSIS — K9089 Other intestinal malabsorption: Secondary | ICD-10-CM

## 2023-05-03 MED ORDER — CHOLESTYRAMINE 4 G PO PACK
4.0000 g | PACK | Freq: Every day | ORAL | 3 refills | Status: AC
Start: 2023-05-03 — End: 2024-04-27

## 2023-05-03 MED ORDER — OMEPRAZOLE 20 MG PO CPDR
20.0000 mg | DELAYED_RELEASE_CAPSULE | Freq: Every day | ORAL | 3 refills | Status: AC
Start: 2023-05-03 — End: 2024-04-27

## 2023-05-03 NOTE — Progress Notes (Signed)
    Celso Amy, PA-C [address]  [address]  [place], [place] [address]  Main: [phone]  Fax: [phone]   Primary Care Physician: Doreene Burke, CNM  Primary Gastroenterologist:  Celso Amy, PA-C   CC: 6-week follow-up GERD and bile salt diarrhea  HPI: Andrea Velez is a 42 y.o. female returns for 6-week follow-up of acid reflux and bile salt diarrhea postcholecystectomy.  She is taking Prilosec 20 mg once daily and cholestyramine powder 1 packet in a drink once daily with great benefit.  GERD and diarrhea have greatly improved on this treatment.  She has changed her diet.  Avoiding coffee, carbonated drinks and red sauce.  All of this has helped.  She is feeling a lot better.  Belching and bloating have decreased.  She denies constipation.  She tried half packet of cholestyramine which did not control her diarrhea.  1 packet daily is working well.  She has no GI symptoms or concerns today.     Current Outpatient Medications  Medication Sig Dispense Refill   cholecalciferol (VITAMIN D3) 25 MCG (1000 UNIT) tablet Take 1,000 Units by mouth daily.     ferrous sulfate 325 (65 FE) MG EC tablet Take 325 mg by mouth 3 (three) times daily with meals.     levonorgestrel (MIRENA) 20 MCG/24HR IUD 1 each by Intrauterine route once.     Multiple Vitamins-Minerals (MULTIVITAMIN WITH MINERALS) tablet Take 1 tablet by mouth daily.     cholestyramine (QUESTRAN) 4 g packet Take 1 packet (4 g total) by mouth daily. 90 packet 3   omeprazole (PRILOSEC) 20 MG capsule Take 1 capsule (20 mg total) by mouth daily. 90 capsule 3   No current facility-administered medications for this visit.    Allergies as of 05/03/2023 - Review Complete 05/03/2023  Allergen Reaction Noted   Other  02/26/2023    Past Medical History:  Diagnosis Date   Anxiety    Seasonal allergies    Vitamin D deficiency     Past Surgical History:  Procedure Laterality Date   BREAST BIOPSY Left 12/28/2021    stereo bx, asymmetry, "X" clip-path pending   CHOLECYSTECTOMY  07/18/2007    Review of Systems:    All systems reviewed and negative except where noted in HPI.   Physical Examination:   BP 120/80 (BP Location: Left Arm, Patient Position: Sitting, Cuff Size: Normal)   Pulse 70   Temp 98.2 F (36.8 C) (Oral)   Ht 5\' 4"  (1.626 m)   Wt 156 lb 6 oz (70.9 kg)   BMI 26.84 kg/m   General: Well-nourished, well-developed in no acute distress.  Neuro: Alert and oriented x 3.  Grossly intact.  Psych: Alert and cooperative, normal mood and affect.   Imaging Studies: No results found.  Assessment and Plan:   Andrea Velez is a 42 y.o. y/o female returns for follow-up of GERD and bile salt diarrhea postcholecystectomy.  GI symptoms are controlled on Prilosec 20 Mg once daily and cholestyramine powder 1 packet daily.  She is feeling a lot better.  1.  GERD  Continue Prilosec 20 mg daily  Continue avoiding GERD trigger foods and drinks  2.  Bile salt diarrhea postcholecystectomy  Continue cholestyramine 1 packet once daily as needed  3.  Colon cancer screening  She will be due for first, screening colonoscopy at age 36 (June 2026).    Celso Amy, PA-C  Follow up 1 year to refill medication.

## 2023-06-20 ENCOUNTER — Other Ambulatory Visit: Payer: Self-pay | Admitting: Physician Assistant

## 2023-06-20 DIAGNOSIS — K9089 Other intestinal malabsorption: Secondary | ICD-10-CM

## 2023-06-20 NOTE — Telephone Encounter (Signed)
Last office visit 05/03/2023 GERD  Last refill 05/03/2023 3 refills 90

## 2023-07-04 ENCOUNTER — Ambulatory Visit
Admission: EM | Admit: 2023-07-04 | Discharge: 2023-07-04 | Disposition: A | Discharge status: Home / Self Care | Payer: 59 | Attending: Family Medicine | Admitting: Family Medicine

## 2023-07-04 DIAGNOSIS — J069 Acute upper respiratory infection, unspecified: Secondary | ICD-10-CM | POA: Insufficient documentation

## 2023-07-04 LAB — RESP PANEL BY RT-PCR (FLU A&B, COVID) ARPGX2
Influenza A by PCR: NEGATIVE
Influenza B by PCR: NEGATIVE
SARS Coronavirus 2 by RT PCR: NEGATIVE

## 2023-07-04 LAB — GROUP A STREP BY PCR: Group A Strep by PCR: NOT DETECTED

## 2023-07-04 MED ORDER — LIDOCAINE VISCOUS HCL 2 % MT SOLN: 15.0000 mL | OROMUCOSAL | 0 refills | Status: DC | PRN

## 2023-07-04 NOTE — ED Provider Notes (Signed)
[facility]    CSN: [id number] Arrival date & time: 07/04/23  0844      History   Chief Complaint Chief Complaint  Patient presents with   Sore Throat   Cough   Otalgia    HPI ZURIAH EHRGOTT is a 42 y.o. female.   HPI  History obtained from the patient. Talley presents for sore throat that started Monday. Started having drainage with cough that she feels in her chest. Has nasal congestion and bilateral ear itching.  No fever.  Has not been sleeping well due to her sore throat.  Has been using Chloraseptic spray, Motrin and drinking tea. Tried Nighttime Mucinex which didn't allow her to sleep.   No known sick contacts. She works at home. She was at her eldest daughter graduation on this past Sunday in [place].     Past Medical History:  Diagnosis Date   Anxiety    Seasonal allergies    Vitamin D deficiency     Patient Active Problem List   Diagnosis Date Noted   Vitamin D deficiency 12/18/2014   Foot pain, right 12/18/2014   Anxiety 10/28/2014   Environmental allergies 10/28/2014    Past Surgical History:  Procedure Laterality Date   BREAST BIOPSY Left 12/28/2021   stereo bx, asymmetry, "X" clip-path pending   CHOLECYSTECTOMY  07/18/2007    OB History     Gravida  2   Para  2   Term  2   Preterm      AB      Living  2      SAB      IAB      Ectopic      Multiple      Live Births  2            Home Medications    Prior to Admission medications   Medication Sig Start Date End Date Taking? Authorizing Provider  cholestyramine (QUESTRAN) 4 g packet Take 1 packet (4 g total) by mouth daily. 05/03/23 04/27/24 Yes Celso Amy, PA-C  levonorgestrel (MIRENA) 20 MCG/24HR IUD 1 each by Intrauterine route once.   Yes [provider]  lidocaine (XYLOCAINE) 2 % solution Use as directed 15 mLs in the mouth or throat as needed for mouth pain. 07/04/23  Yes Rylen Swindler, Seward Meth, DO  cholecalciferol (VITAMIN D3) 25 MCG  (1000 UNIT) tablet Take 1,000 Units by mouth daily.    [provider]  ferrous sulfate 325 (65 FE) MG EC tablet Take 325 mg by mouth 3 (three) times daily with meals.    [provider]  Multiple Vitamins-Minerals (MULTIVITAMIN WITH MINERALS) tablet Take 1 tablet by mouth daily.    [provider]  omeprazole (PRILOSEC) 20 MG capsule Take 1 capsule (20 mg total) by mouth daily. 05/03/23 04/27/24  Celso Amy, PA-C    Family History Family History  Problem Relation Age of Onset   Hypertension Mother    Glaucoma Father    Breast cancer Paternal Aunt    Cancer Paternal Aunt        breast   Diabetes Maternal Grandmother    Cancer Paternal Grandfather        colon   Ovarian cancer Neg Hx    Colon cancer Neg Hx     Social History Social History   Tobacco Use   Smoking status: Never   Smokeless tobacco: Never  Vaping Use   Vaping status: Never Used  Substance Use Topics  Alcohol use: Yes    Alcohol/week: 1.0 - 2.0 standard drink of alcohol    Types: 1 - 2 Glasses of wine per week    Comment: rare   Drug use: No     Allergies   Other   Review of Systems Review of Systems: negative unless otherwise stated in HPI.      Physical Exam Triage Vital Signs ED Triage Vitals [07/04/23 0932]  Encounter Vitals Group     BP      Systolic BP Percentile      Diastolic BP Percentile      Pulse      Resp      Temp      Temp src      SpO2      Weight      Height      Head Circumference      Peak Flow      Pain Score 7     Pain Loc      Pain Education      Exclude from Growth Chart    No data found.  Updated Vital Signs BP 139/82 (BP Location: Left Arm)   Pulse 100   Temp 98.4 F (36.9 C) (Oral)   Resp 18   SpO2 100%   Visual Acuity Right Eye Distance:   Left Eye Distance:   Bilateral Distance:    Right Eye Near:   Left Eye Near:    Bilateral Near:     Physical Exam GEN:     alert, non-toxic appearing female in no distress     HENT:  mucus membranes moist, oropharyngeal without lesions, mild erythema, no\ tonsillar hypertrophy or exudates, no nasal discharge, bilateral TM normal EYES:   no scleral injection or discharge NECK:  normal ROM, + lymphadenopathy, no meningismus   RESP:  no increased work of breathing, clear to auscultation bilaterally CVS:   regular rate and rhythm Skin:   warm and dry    UC Treatments / Results  Labs (all labs ordered are listed, but only abnormal results are displayed) Labs Reviewed  GROUP A STREP BY PCR  RESP PANEL BY RT-PCR (FLU A&B, COVID) [id number]    EKG   Radiology No results found.  Procedures Procedures (including critical care time)  Medications Ordered in UC Medications - No data to display  Initial Impression / Assessment and Plan / UC Course  I have reviewed the triage vital signs and the nursing notes.  Pertinent labs & imaging results that were available during my care of the patient were reviewed by me and considered in my medical decision making (see chart for details).       Pt is a 42 y.o. female who presents for 2 days of respiratory symptoms. Amarah is afebrile here without recent antipyretics. Satting well on room air. Overall pt is non-toxic appearing, well hydrated, without respiratory distress. Pulmonary exam is unremarkable.  COVID and influenza pain was negative. Strep testing obtained and was negative.  History most consistent with viral respiratory illness. Discussed symptomatic treatment.  Explained lack of efficacy of antibiotics in viral disease.  Typical duration of symptoms discussed. Lidocaine solution for throat pain.   Return and ED precautions given and voiced understanding. Discussed MDM, treatment plan and plan for follow-up with patient who agrees with plan.     Final Clinical Impressions(s) / UC Diagnoses   Final diagnoses:  Viral URI with cough     Discharge Instructions  Your COVID, influenza (A and B), and  strep. Stop by the pharmacy to pick up your lidocaine solution for your throat pain.    You can take Tylenol and/or Ibuprofen as needed for fever reduction and pain relief.    For cough: You can also use guaifenesin and dextromethorphan (Delsym)  for cough. You can use a humidifier for chest congestion and cough.  If you don't have a humidifier, you can sit in the bathroom with the hot shower running.      For sore throat: try warm salt water gargles, Mucinex sore throat cough drops or cepacol lozenges, throat spray, warm tea or water with lemon/honey, popsicles or ice, or OTC cold relief medicine for throat discomfort. You can also purchase chloraseptic spray at the pharmacy or dollar store.   For congestion: take a daily anti-histamine like Zyrtec, Claritin, and a oral decongestant, such as pseudoephedrine.  You can also use Flonase 1-2 sprays in each nostril daily. Afrin is also a good option, if you do not have high blood pressure.    It is important to stay hydrated: drink plenty of fluids (water, gatorade/powerade/pedialyte, juices, or teas) to keep your throat moisturized and help further relieve irritation/discomfort.    Return or go to the Emergency Department if symptoms worsen or do not improve in the next few days      ED Prescriptions     Medication Sig Dispense Auth. Provider   lidocaine (XYLOCAINE) 2 % solution Use as directed 15 mLs in the mouth or throat as needed for mouth pain. 100 mL Katha Cabal, DO      PDMP not reviewed this encounter.   Katha Cabal, DO 07/04/23 1437

## 2023-07-04 NOTE — Discharge Instructions (Addendum)
Your COVID, influenza (A and B), and strep. Stop by the pharmacy to pick up your lidocaine solution for your throat pain.    You can take Tylenol and/or Ibuprofen as needed for fever reduction and pain relief.    For cough: You can also use guaifenesin and dextromethorphan (Delsym)  for cough. You can use a humidifier for chest congestion and cough.  If you don't have a humidifier, you can sit in the bathroom with the hot shower running.      For sore throat: try warm salt water gargles, Mucinex sore throat cough drops or cepacol lozenges, throat spray, warm tea or water with lemon/honey, popsicles or ice, or OTC cold relief medicine for throat discomfort. You can also purchase chloraseptic spray at the pharmacy or dollar store.   For congestion: take a daily anti-histamine like Zyrtec, Claritin, and a oral decongestant, such as pseudoephedrine.  You can also use Flonase 1-2 sprays in each nostril daily. Afrin is also a good option, if you do not have high blood pressure.    It is important to stay hydrated: drink plenty of fluids (water, gatorade/powerade/pedialyte, juices, or teas) to keep your throat moisturized and help further relieve irritation/discomfort.    Return or go to the Emergency Department if symptoms worsen or do not improve in the next few days

## 2023-07-04 NOTE — ED Triage Notes (Signed)
Sore throat for 2 days Cough-chest congestion 1 day No fever.  Ears feel itchy since Monday night

## 2023-09-05 ENCOUNTER — Encounter: Payer: Self-pay | Admitting: Certified Nurse Midwife

## 2023-09-12 ENCOUNTER — Other Ambulatory Visit: Payer: Self-pay | Admitting: Certified Nurse Midwife

## 2023-09-12 MED ORDER — ESTRADIOL 0.025 MG/24HR TD PTTW: 1.0000 | MEDICATED_PATCH | TRANSDERMAL | 12 refills | Status: DC

## 2024-02-14 ENCOUNTER — Ambulatory Visit
Admission: RE | Admit: 2024-02-14 | Discharge: 2024-02-14 | Disposition: A | Discharge status: Home / Self Care | Source: Ambulatory Visit | Attending: Certified Nurse Midwife | Admitting: Certified Nurse Midwife

## 2024-02-14 DIAGNOSIS — Z1231 Encounter for screening mammogram for malignant neoplasm of breast: Secondary | ICD-10-CM | POA: Diagnosis present

## 2024-02-14 DIAGNOSIS — Z01419 Encounter for gynecological examination (general) (routine) without abnormal findings: Secondary | ICD-10-CM | POA: Diagnosis present

## 2024-03-19 NOTE — Progress Notes (Unsigned)
 GYNECOLOGY ANNUAL PREVENTATIVE CARE ENCOUNTER NOTE  History:     Andrea Velez is a 43 y.o. [id number] female here for a routine annual gynecologic exam.  Current complaints: states she is feeling better with estrogen patches but is still not herself. Discussed integrative medicine referral.   Denies abnormal vaginal bleeding, discharge, pelvic pain, problems with intercourse or other gynecologic concerns.     Social Relationship:married  Living:spouse and two daughters Work:full time Medical Billing Exercise: Exercising daily Smoke/Alcohol/drug use:no/occasional/no  Gynecologic History No LMP recorded (lmp unknown). (Menstrual status: IUD). Contraception: IUD, placed 920/2018 ( replace 2026) Last Pap: 02/22/2022. Results were: ASC-US  with negative HPV Last mammogram: 02/14/2024. Results were: normal  Obstetric History OB History  Gravida Para Term Preterm AB Living  2 2 2   2   SAB IAB Ectopic Multiple Live Births      2    # Outcome Date GA Lbr Len/2nd Weight Sex Type Anes PTL Lv  2 Term 2005   7 lb 14 oz (3.572 kg) F Vag-Spont   LIV  1 Term 2002   7 lb 14 oz (3.572 kg) F Vag-Spont   LIV    Past Medical History:  Diagnosis Date   Anxiety    Seasonal allergies    Vitamin D  deficiency     Past Surgical History:  Procedure Laterality Date   BREAST BIOPSY Left 12/28/2021   stereo bx, asymmetry, X clip-path pending   CHOLECYSTECTOMY  07/18/2007    Current Outpatient Medications on File Prior to Visit  Medication Sig Dispense Refill   cholecalciferol (VITAMIN D3) 25 MCG (1000 UNIT) tablet Take 1,000 Units by mouth daily.     cholestyramine  (QUESTRAN ) 4 g packet Take 1 packet (4 g total) by mouth daily. 90 packet 3   estradiol  (VIVELLE -DOT) 0.025 MG/24HR Place 1 patch onto the skin 2 (two) times a week. 8 patch 12   levonorgestrel  (MIRENA ) 20 MCG/24HR IUD 1 each by Intrauterine route once.     loratadine (CLARITIN) 10 MG tablet Take 10 mg by mouth  daily.     Multiple Vitamins-Minerals (MULTIVITAMIN WITH MINERALS) tablet Take 1 tablet by mouth daily.     omeprazole  (PRILOSEC) 20 MG capsule Take 1 capsule (20 mg total) by mouth daily. 90 capsule 3   No current facility-administered medications on file prior to visit.    Allergies  Allergen Reactions   Oxycodone-Acetaminophen Hives   Other Swelling    Hazelnut    Social History:  reports that she has never smoked. She has never used smokeless tobacco. She reports current alcohol use of about 1.0 - 2.0 standard drink of alcohol per week. She reports that she does not use drugs.  Family History  Problem Relation Age of Onset   Hypertension Mother    Glaucoma Father    Breast cancer Paternal Aunt    Cancer Paternal Aunt        breast   Diabetes Maternal Grandmother    Cancer Paternal Grandfather        colon   Ovarian cancer Neg Hx    Colon cancer Neg Hx     The following portions of the patient's history were reviewed and updated as appropriate: allergies, current medications, past family history, past medical history, past social history, past surgical history and problem list.  Review of Systems Pertinent items noted in HPI and remainder of comprehensive ROS otherwise negative.  Physical Exam:  BP 121/66   Pulse  77   Ht 5' 4 (1.626 m)   Wt 164 lb (74.4 kg)   LMP  (LMP Unknown)   BMI 28.15 kg/m  CONSTITUTIONAL: Well-developed, well-nourished female in no acute distress.  HENT:  Normocephalic, atraumatic, External right and left ear normal. Oropharynx is clear and moist EYES: Conjunctivae and EOM are normal. Pupils are equal, round, and reactive to light. No scleral icterus.  NECK: Normal range of motion, supple, no masses.  Normal thyroid.  SKIN: Skin is warm and dry. No rash noted. Not diaphoretic. No erythema. No pallor. MUSCULOSKELETAL: Normal range of motion. No tenderness.  No cyanosis, clubbing, or edema.  2+ distal pulses. NEUROLOGIC: Alert and oriented to  person, place, and time. Normal reflexes, muscle tone coordination.  PSYCHIATRIC: Normal mood and affect. Normal behavior. Normal judgment and thought content. CARDIOVASCULAR: Normal heart rate noted, regular rhythm RESPIRATORY: Clear to auscultation bilaterally. Effort and breath sounds normal, no problems with respiration noted. BREASTS: Symmetric in size. No masses, tenderness, skin changes, nipple drainage, or lymphadenopathy bilaterally.  ABDOMEN: Soft, no distention noted.  No tenderness, rebound or guarding.  PELVIC: Normal appearing external genitalia and urethral meatus; normal appearing vaginal mucosa and cervix.  No abnormal discharge noted.  Pap smear not due.  Normal uterine size, no other palpable masses, no uterine or adnexal tenderness. Strings present  .   Assessment and Plan:   Annual Well Women GYN Exam   Pap: due 2026  Mammogram : ordered  Labs: none  Refills: estrogen patch Referral: integrative medicine /functional medicine  Routine preventative health maintenance measures emphasized. Please refer to After Visit Summary for other counseling recommendations.      Zelda Hummer, CNM [facility]  [facility],  [facility]

## 2024-03-20 ENCOUNTER — Encounter: Payer: Self-pay | Admitting: Certified Nurse Midwife

## 2024-03-20 ENCOUNTER — Ambulatory Visit: Admitting: Certified Nurse Midwife

## 2024-03-20 VITALS — BP 121/66 | HR 77 | Ht 64.0 in | Wt 164.0 lb

## 2024-03-20 DIAGNOSIS — Z1231 Encounter for screening mammogram for malignant neoplasm of breast: Secondary | ICD-10-CM

## 2024-03-20 DIAGNOSIS — Z79890 Hormone replacement therapy (postmenopausal): Secondary | ICD-10-CM | POA: Diagnosis not present

## 2024-03-20 DIAGNOSIS — Z01419 Encounter for gynecological examination (general) (routine) without abnormal findings: Secondary | ICD-10-CM | POA: Diagnosis not present

## 2024-03-20 DIAGNOSIS — F419 Anxiety disorder, unspecified: Secondary | ICD-10-CM

## 2024-03-20 DIAGNOSIS — Z23 Encounter for immunization: Secondary | ICD-10-CM | POA: Diagnosis not present

## 2024-03-20 DIAGNOSIS — R232 Flushing: Secondary | ICD-10-CM

## 2024-03-20 DIAGNOSIS — R5383 Other fatigue: Secondary | ICD-10-CM

## 2024-03-20 MED ORDER — ESTRADIOL 0.025 MG/24HR TD PTTW: 1.0000 | MEDICATED_PATCH | TRANSDERMAL | 12 refills | Status: AC
